# Patient Record
Sex: Female | Born: 1950
Health system: Southern US, Community
[De-identification: ages and names within clinical notes are randomized; demographics above are authoritative.]

## PROBLEM LIST (undated history)

## (undated) DIAGNOSIS — E785 Hyperlipidemia, unspecified: Secondary | ICD-10-CM

## (undated) DIAGNOSIS — H3530 Unspecified macular degeneration: Secondary | ICD-10-CM

## (undated) DIAGNOSIS — F329 Major depressive disorder, single episode, unspecified: Secondary | ICD-10-CM

## (undated) DIAGNOSIS — F32A Depression, unspecified: Secondary | ICD-10-CM

## (undated) HISTORY — DX: Unspecified macular degeneration: H35.30

## (undated) HISTORY — DX: Major depressive disorder, single episode, unspecified: F32.9

## (undated) HISTORY — DX: Hyperlipidemia, unspecified: E78.5

## (undated) HISTORY — PX: COLONOSCOPY: SHX174

## (undated) HISTORY — DX: Depression, unspecified: F32.A

## (undated) HISTORY — PX: BELPHAROPTOSIS REPAIR: SHX369

## (undated) HISTORY — PX: KNEE ARTHROSCOPY: SUR90

---

## 1999-01-27 DIAGNOSIS — F32A Depression, unspecified: Secondary | ICD-10-CM | POA: Insufficient documentation

## 2000-03-30 ENCOUNTER — Encounter: Payer: Self-pay | Admitting: Obstetrics and Gynecology

## 2000-03-30 ENCOUNTER — Encounter: Admission: RE | Admit: 2000-03-30 | Discharge: 2000-03-30 | Payer: Self-pay | Admitting: Obstetrics and Gynecology

## 2001-04-01 ENCOUNTER — Encounter: Payer: Self-pay | Admitting: Obstetrics and Gynecology

## 2001-04-01 ENCOUNTER — Encounter: Admission: RE | Admit: 2001-04-01 | Discharge: 2001-04-01 | Payer: Self-pay | Admitting: Obstetrics and Gynecology

## 2002-04-12 ENCOUNTER — Encounter: Admission: RE | Admit: 2002-04-12 | Discharge: 2002-04-12 | Payer: Self-pay | Admitting: Internal Medicine

## 2003-03-07 ENCOUNTER — Encounter: Admission: RE | Admit: 2003-03-07 | Discharge: 2003-03-07 | Payer: Self-pay | Admitting: Obstetrics and Gynecology

## 2003-03-29 ENCOUNTER — Encounter: Admission: RE | Admit: 2003-03-29 | Discharge: 2003-03-29 | Payer: Self-pay | Admitting: Internal Medicine

## 2003-05-27 HISTORY — PX: LIPOSUCTION: SHX10

## 2004-03-14 DIAGNOSIS — D229 Melanocytic nevi, unspecified: Secondary | ICD-10-CM

## 2004-03-14 HISTORY — DX: Melanocytic nevi, unspecified: D22.9

## 2004-04-11 ENCOUNTER — Encounter: Admission: RE | Admit: 2004-04-11 | Discharge: 2004-04-11 | Payer: Self-pay | Admitting: Obstetrics and Gynecology

## 2004-07-09 ENCOUNTER — Encounter: Admission: RE | Admit: 2004-07-09 | Discharge: 2004-07-09 | Payer: Self-pay | Admitting: Obstetrics and Gynecology

## 2005-04-16 ENCOUNTER — Encounter: Admission: RE | Admit: 2005-04-16 | Discharge: 2005-04-16 | Payer: Self-pay | Admitting: Obstetrics and Gynecology

## 2005-05-27 ENCOUNTER — Ambulatory Visit: Payer: Self-pay | Admitting: Internal Medicine

## 2005-07-28 ENCOUNTER — Ambulatory Visit: Payer: Self-pay | Admitting: Internal Medicine

## 2005-08-04 ENCOUNTER — Ambulatory Visit: Payer: Self-pay | Admitting: Internal Medicine

## 2006-04-21 ENCOUNTER — Encounter: Admission: RE | Admit: 2006-04-21 | Discharge: 2006-04-21 | Payer: Self-pay | Admitting: Obstetrics and Gynecology

## 2007-04-22 ENCOUNTER — Ambulatory Visit (HOSPITAL_COMMUNITY): Admission: RE | Admit: 2007-04-22 | Discharge: 2007-04-22 | Payer: Self-pay | Admitting: Obstetrics and Gynecology

## 2007-05-13 ENCOUNTER — Other Ambulatory Visit: Admission: RE | Admit: 2007-05-13 | Discharge: 2007-05-13 | Payer: Self-pay | Admitting: Obstetrics and Gynecology

## 2007-06-03 ENCOUNTER — Ambulatory Visit: Payer: Self-pay | Admitting: Internal Medicine

## 2007-06-08 ENCOUNTER — Telehealth: Payer: Self-pay | Admitting: Internal Medicine

## 2007-06-10 ENCOUNTER — Ambulatory Visit: Payer: Self-pay | Admitting: Internal Medicine

## 2007-06-10 ENCOUNTER — Encounter: Payer: Self-pay | Admitting: Internal Medicine

## 2007-06-14 ENCOUNTER — Encounter: Payer: Self-pay | Admitting: Internal Medicine

## 2007-09-09 ENCOUNTER — Telehealth: Payer: Self-pay | Admitting: Internal Medicine

## 2008-01-05 ENCOUNTER — Telehealth: Payer: Self-pay | Admitting: Internal Medicine

## 2008-03-23 ENCOUNTER — Ambulatory Visit: Payer: Self-pay | Admitting: Internal Medicine

## 2008-03-26 ENCOUNTER — Encounter: Admission: RE | Admit: 2008-03-26 | Discharge: 2008-03-26 | Payer: Self-pay | Admitting: Internal Medicine

## 2008-04-24 ENCOUNTER — Ambulatory Visit (HOSPITAL_COMMUNITY): Admission: RE | Admit: 2008-04-24 | Discharge: 2008-04-24 | Payer: Self-pay | Admitting: Internal Medicine

## 2008-05-08 ENCOUNTER — Ambulatory Visit: Payer: Self-pay | Admitting: Internal Medicine

## 2008-05-08 LAB — CONVERTED CEMR LAB
Basophils Absolute: 0 10*3/uL (ref 0.0–0.1)
Basophils Relative: 0.1 % (ref 0.0–3.0)
Bilirubin Urine: NEGATIVE
Blood in Urine, dipstick: NEGATIVE
CO2: 33 meq/L — ABNORMAL HIGH (ref 19–32)
Chloride: 108 meq/L (ref 96–112)
Creatinine, Ser: 0.8 mg/dL (ref 0.4–1.2)
Eosinophils Absolute: 0.2 10*3/uL (ref 0.0–0.7)
GFR calc non Af Amer: 78.79 mL/min (ref >60)
Glucose, Bld: 100 mg/dL — ABNORMAL HIGH (ref 70–99)
Glucose, Urine, Semiquant: NEGATIVE
HCT: 39.8 % (ref 36.0–46.0)
Hemoglobin: 13.5 g/dL (ref 12.0–15.0)
Lymphs Abs: 2.6 10*3/uL (ref 0.7–4.0)
MCHC: 33.8 g/dL (ref 30.0–36.0)
Neutro Abs: 3.5 10*3/uL (ref 1.4–7.7)
Nitrite: NEGATIVE
Platelets: 187 10*3/uL (ref 150.0–400.0)
RBC: 4.41 M/uL (ref 3.87–5.11)
Sodium: 147 meq/L — ABNORMAL HIGH (ref 135–145)
Specific Gravity, Urine: 1.01
TSH: 1.78 microintl units/mL (ref 0.35–5.50)
Total Bilirubin: 0.9 mg/dL (ref 0.3–1.2)
Total CHOL/HDL Ratio: 2
Triglycerides: 75 mg/dL (ref 0.0–149.0)
VLDL: 15 mg/dL (ref 0.0–40.0)
pH: 7

## 2008-05-15 ENCOUNTER — Ambulatory Visit: Payer: Self-pay | Admitting: Internal Medicine

## 2008-06-06 ENCOUNTER — Other Ambulatory Visit: Admission: RE | Admit: 2008-06-06 | Discharge: 2008-06-06 | Payer: Self-pay | Admitting: Obstetrics and Gynecology

## 2009-04-24 ENCOUNTER — Ambulatory Visit: Payer: Self-pay | Admitting: Family Medicine

## 2009-04-25 ENCOUNTER — Ambulatory Visit (HOSPITAL_COMMUNITY): Admission: RE | Admit: 2009-04-25 | Discharge: 2009-04-25 | Payer: Self-pay | Admitting: Obstetrics and Gynecology

## 2010-02-15 ENCOUNTER — Encounter: Payer: Self-pay | Admitting: Obstetrics and Gynecology

## 2010-02-25 NOTE — Assessment & Plan Note (Signed)
Summary: neck pain with arm numbness/dm   Vital Signs:  Patient profile:   60 year old female Temp:     98.7 degrees F oral BP sitting:   150 / 80  (left arm) Cuff size:   regular  Vitals Entered By: Sid Falcon LPN (April 24, 2009 4:11 PM) CC: neck pain, arm numbness   History of Present Illness: Patient seen as an acute visit with mostly right trapezius pain for the past few days. Does have some sensation of discomfort radiating occasionally toward the shoulder. No arm pain. Denies any significant numbness or weakness upper extremity. Pain is mild in intensity. Aspirin helps. She thinks this may be related to stress. Frequently has poor sleep issues. Several adjustment problems since her husband died about 4 years ago.  Allergies: 1)  ! Tylenol  Review of Systems  The patient denies chest pain, dyspnea on exertion, prolonged cough, headaches, and muscle weakness.    Physical Exam  General:  Well-developed,well-nourished,in no acute distress; alert,appropriate and cooperative throughout examination Head:  Normocephalic and atraumatic without obvious abnormalities. No apparent alopecia or balding. Neck:  No deformities, masses, or tenderness noted. Lungs:  Normal respiratory effort, chest expands symmetrically. Lungs are clear to auscultation, no crackles or wheezes. Heart:  Normal rate and regular rhythm. S1 and S2 normal without gallop, murmur, click, rub or other extra sounds. Msk:  tender right trapezius muscles Extremities:  upper extremities reveal no evidence for any atrophy. Good distal pulses. Full range of motion right shoulder Neurologic:  alert & oriented X3, cranial nerves II-XII intact, strength normal in all extremities, sensation intact to light touch, and DTRs symmetrical and normal.     Impression & Recommendations:  Problem # 1:  BACK PAIN, UPPER (ICD-724.5) suspect muscular. Trial of low dose muscle relaxer and also advised moist heat, massage, and regular  exercise Her updated medication list for this problem includes:    Cyclobenzaprine Hcl 5 Mg Tabs (Cyclobenzaprine hcl) ..... One by mouth at bedtime as needed pain and muscle spasm  Complete Medication List: 1)  Multivitamins Tabs (Multiple vitamin) .... Once daily 2)  Citracal/vitamin D 250-200 Mg-unit Tabs (Calcium citrate-vitamin d) .... 4 once daily 3)  Evening Primrose Oil 500 Mg Caps (Evening primrose oil) .... Once daily 4)  Vitamin B-12 1000 Mcg Tabs (Cyanocobalamin) .... Once daily 5)  Vitamin C Cr 500 Mg Cr-caps (Ascorbic acid) .... Once daily 6)  Fish Oil Oil (Fish oil) .... Omega 3  840mg  omega 6 420mg  omega 9  420mg  once daily 7)  Alprazolam 0.5 Mg Tabs (Alprazolam) .... Take one by mouth every other day as needed 8)  Cyclobenzaprine Hcl 5 Mg Tabs (Cyclobenzaprine hcl) .... One by mouth at bedtime as needed pain and muscle spasm  Patient Instructions: 1)  Follow up with your primary provider if you note any weakness, numbness of upper extremity, or any progressive pain. 2)  Use moist heat and consider muscle massage. 3)  Continue aspirin as needed. Prescriptions: CYCLOBENZAPRINE HCL 5 MG TABS (CYCLOBENZAPRINE HCL) one by mouth at bedtime as needed pain and muscle spasm  #30 x 0   Entered and Authorized by:   Evelena Peat MD   Signed by:   Evelena Peat MD on 04/24/2009   Method used:   Electronically to        [facility] # [phone]* (retail)       [address]       [place], [place]  [address]  Ph: [phone]       Fax: [phone]   RxID:   [id number]

## 2010-05-09 ENCOUNTER — Telehealth: Payer: Self-pay | Admitting: *Deleted

## 2010-05-09 MED ORDER — DOXYCYCLINE HYCLATE 100 MG PO TABS: 100.0000 mg | ORAL_TABLET | Freq: Two times a day (BID) | ORAL | Status: DC

## 2010-05-09 NOTE — Telephone Encounter (Signed)
Pt wants an antibiotic for her allergies that have turned into a sinus infection with green/yellow mucus. [facility].

## 2010-05-09 NOTE — Telephone Encounter (Signed)
Doxycycline 100 mg po bid for 7 days IF symptoms have been ongoing for >2weeks Otherwise OV with available physicians

## 2010-05-14 ENCOUNTER — Other Ambulatory Visit: Payer: Self-pay | Admitting: Obstetrics and Gynecology

## 2010-05-14 DIAGNOSIS — Z1231 Encounter for screening mammogram for malignant neoplasm of breast: Secondary | ICD-10-CM

## 2010-05-20 ENCOUNTER — Ambulatory Visit (HOSPITAL_COMMUNITY): Payer: Self-pay

## 2010-05-29 ENCOUNTER — Ambulatory Visit (HOSPITAL_COMMUNITY)
Admission: RE | Admit: 2010-05-29 | Discharge: 2010-05-29 | Disposition: A | Discharge status: Home / Self Care | Payer: Managed Care, Other (non HMO) | Source: Ambulatory Visit | Attending: Obstetrics and Gynecology | Admitting: Obstetrics and Gynecology

## 2010-05-29 DIAGNOSIS — Z1231 Encounter for screening mammogram for malignant neoplasm of breast: Secondary | ICD-10-CM | POA: Insufficient documentation

## 2011-06-08 ENCOUNTER — Other Ambulatory Visit: Payer: Self-pay | Admitting: Internal Medicine

## 2011-06-08 DIAGNOSIS — Z1231 Encounter for screening mammogram for malignant neoplasm of breast: Secondary | ICD-10-CM

## 2011-06-11 ENCOUNTER — Telehealth: Payer: Self-pay | Admitting: *Deleted

## 2011-06-11 NOTE — Telephone Encounter (Signed)
Alprazolam 0.25 mg po bid prn anxiety, for 5 days. #10/0 refills

## 2011-06-11 NOTE — Telephone Encounter (Signed)
Pt needs nerve meds for mother's death, please.

## 2011-06-11 NOTE — Telephone Encounter (Signed)
Rx called to pharmacy voicemail, pt notified. Pt states her dad is also in ICU in [facility] and she may have to fly out at any moment re: his health. Pt is concerned that 5 days is not going to be enough of the medication.  Please advise.

## 2011-06-11 NOTE — Telephone Encounter (Signed)
Left message to call back .  Please call back.

## 2011-06-11 NOTE — Telephone Encounter (Signed)
Pt left message, and we called back but we had to leave a message  To call back.

## 2011-06-18 ENCOUNTER — Telehealth: Payer: Self-pay

## 2011-06-18 MED ORDER — ALPRAZOLAM 0.5 MG PO TABS: 0.5000 mg | ORAL_TABLET | Freq: Three times a day (TID) | ORAL | Status: AC | PRN

## 2011-06-18 NOTE — Telephone Encounter (Signed)
Dr Cato Mulligan is not in the office today.  Per Dr Caryl Never ok to call in xanax 0.5 mg 1 po every 8 hours PRN #15 no refills.  Dr Caryl Never also wanted pt to know that she needed to grieve over the loss of her parents and try not use as much as possible.  Pt aware, rx called in to [facility].

## 2011-06-18 NOTE — Telephone Encounter (Signed)
Pt called and states that she called earlier this week asking for some alprazolam for her nerves due to losing her mother.  Pt states she received a call last night that her father passed away as well and pt has to leave town and plan a double funeral.  Pt states 10 pills of alprazolam is not enough.  Pt would like to know if she can have another rx sent to pharmacy to help her get through this.  Pt is aware Dr. Cato Mulligan is out of the office and ask if message can be sent to another physician.  Pls advise.

## 2011-06-26 ENCOUNTER — Other Ambulatory Visit: Payer: Managed Care, Other (non HMO)

## 2011-07-01 ENCOUNTER — Ambulatory Visit (HOSPITAL_COMMUNITY): Payer: Managed Care, Other (non HMO)

## 2011-07-03 ENCOUNTER — Encounter: Payer: Managed Care, Other (non HMO) | Admitting: Internal Medicine

## 2011-07-06 ENCOUNTER — Other Ambulatory Visit: Payer: Self-pay | Admitting: Internal Medicine

## 2011-07-06 DIAGNOSIS — Z1231 Encounter for screening mammogram for malignant neoplasm of breast: Secondary | ICD-10-CM

## 2011-07-27 ENCOUNTER — Ambulatory Visit (HOSPITAL_COMMUNITY)
Admission: RE | Admit: 2011-07-27 | Discharge: 2011-07-27 | Disposition: A | Discharge status: Home / Self Care | Payer: Managed Care, Other (non HMO) | Source: Ambulatory Visit | Attending: Internal Medicine | Admitting: Internal Medicine

## 2011-07-27 DIAGNOSIS — Z1231 Encounter for screening mammogram for malignant neoplasm of breast: Secondary | ICD-10-CM | POA: Insufficient documentation

## 2011-09-24 ENCOUNTER — Other Ambulatory Visit: Payer: Self-pay | Admitting: *Deleted

## 2011-09-24 ENCOUNTER — Other Ambulatory Visit (INDEPENDENT_AMBULATORY_CARE_PROVIDER_SITE_OTHER): Payer: Managed Care, Other (non HMO)

## 2011-09-24 DIAGNOSIS — Z0000 Encounter for general adult medical examination without abnormal findings: Secondary | ICD-10-CM

## 2011-09-24 LAB — BASIC METABOLIC PANEL
BUN: 16 mg/dL (ref 6–23)
CO2: 30 mEq/L (ref 19–32)
Calcium: 9.7 mg/dL (ref 8.4–10.5)
Chloride: 103 mEq/L (ref 96–112)
Creatinine, Ser: 0.9 mg/dL (ref 0.4–1.2)
GFR: 67.11 mL/min (ref >60.00)
Glucose, Bld: 84 mg/dL (ref 70–99)
Potassium: 6.2 mEq/L (ref 3.5–5.1)
Sodium: 141 mEq/L (ref 135–145)

## 2011-09-24 LAB — POCT URINALYSIS DIPSTICK
Blood, UA: NEGATIVE
Glucose, UA: NEGATIVE
Ketones, UA: NEGATIVE
Leukocytes, UA: NEGATIVE
Spec Grav, UA: 1.015
Urobilinogen, UA: 0.2

## 2011-09-24 LAB — LIPID PANEL
HDL: 88.3 mg/dL (ref >39.00)
Total CHOL/HDL Ratio: 2
Triglycerides: 79 mg/dL (ref 0.0–149.0)

## 2011-09-24 LAB — CBC WITH DIFFERENTIAL/PLATELET
Basophils Absolute: 0 10*3/uL (ref 0.0–0.1)
Eosinophils Relative: 1.4 % (ref 0.0–5.0)
Hemoglobin: 14.2 g/dL (ref 12.0–15.0)
Lymphs Abs: 2.9 10*3/uL (ref 0.7–4.0)
Monocytes Absolute: 0.6 10*3/uL (ref 0.1–1.0)
RBC: 4.87 Mil/uL (ref 3.87–5.11)
WBC: 6.7 10*3/uL (ref 4.5–10.5)

## 2011-09-24 LAB — HEPATIC FUNCTION PANEL
Alkaline Phosphatase: 76 U/L (ref 39–117)
Total Bilirubin: 0.7 mg/dL (ref 0.3–1.2)

## 2011-09-29 ENCOUNTER — Other Ambulatory Visit (INDEPENDENT_AMBULATORY_CARE_PROVIDER_SITE_OTHER): Payer: Managed Care, Other (non HMO)

## 2011-09-29 DIAGNOSIS — E875 Hyperkalemia: Secondary | ICD-10-CM

## 2011-09-29 LAB — BASIC METABOLIC PANEL
Creatinine, Ser: 0.7 mg/dL (ref 0.4–1.2)
GFR: 87.93 mL/min (ref >60.00)
Glucose, Bld: 87 mg/dL (ref 70–99)
Sodium: 134 mEq/L — ABNORMAL LOW (ref 135–145)

## 2011-09-30 NOTE — Progress Notes (Signed)
Quick Note:  Called pt and pt is aware. ______

## 2011-10-14 ENCOUNTER — Other Ambulatory Visit: Payer: Managed Care, Other (non HMO)

## 2011-10-21 ENCOUNTER — Encounter: Payer: Self-pay | Admitting: Internal Medicine

## 2011-10-21 ENCOUNTER — Ambulatory Visit (INDEPENDENT_AMBULATORY_CARE_PROVIDER_SITE_OTHER): Payer: Managed Care, Other (non HMO) | Admitting: Internal Medicine

## 2011-10-21 VITALS — BP 130/82 | HR 76 | Temp 98.8°F | Ht 59.5 in | Wt 121.0 lb

## 2011-10-21 DIAGNOSIS — F3289 Other specified depressive episodes: Secondary | ICD-10-CM

## 2011-10-21 DIAGNOSIS — F32A Depression, unspecified: Secondary | ICD-10-CM

## 2011-10-21 DIAGNOSIS — F329 Major depressive disorder, single episode, unspecified: Secondary | ICD-10-CM

## 2011-10-21 DIAGNOSIS — Z0000 Encounter for general adult medical examination without abnormal findings: Secondary | ICD-10-CM

## 2011-10-21 MED ORDER — BUPROPION HCL ER (XL) 150 MG PO TB24: 150.0000 mg | ORAL_TABLET | Freq: Every day | ORAL | Status: DC

## 2011-10-21 NOTE — Progress Notes (Signed)
  Subjective:    Patient ID: Denise Bradley, female    DOB: 1950-02-02, 61 y.o.   MRN: [id number]  HPI cpx  As soon as I walked in the room the patient started crying. She admitted that she has not felt well for years. She feels chronically depressed. She has occasionally seen a psychologist. History reviewed. No pertinent past medical history.  History   Social History  . Marital Status: Widowed    Spouse Name: N/A    Number of Children: N/A  . Years of Education: N/A   Occupational History  . Not on file.   Social History Main Topics  . Smoking status: Never Smoker   . Smokeless tobacco: Not on file  . Alcohol Use: No  . Drug Use: No  . Sexually Active: Not on file   Other Topics Concern  . Not on file   Social History Narrative  . No narrative on file    Past Surgical History  Procedure Date  . Knee arthroscopy   . Liposuction 5/05    Family History  Problem Relation Age of Onset  . Diabetes Father   . Hypertension Father   . Heart disease Father     CABG  . Cancer Mother 44    cancer unknown primary  . COPD Mother     Allergies  Allergen Reactions  . Acetaminophen     REACTION: swells throat shut/can't breathe    Current Outpatient Prescriptions on File Prior to Visit  Medication Sig Dispense Refill  . buPROPion (WELLBUTRIN XL) 150 MG 24 hr tablet Take 1 tablet (150 mg total) by mouth daily.  30 tablet  3     patient denies chest pain, shortness of breath, orthopnea. Denies lower extremity edema, abdominal pain, change in appetite, change in bowel movements. Patient denies rashes, musculoskeletal complaints. No other specific complaints in a complete review of systems.   BP 130/82  Pulse 76  Temp 98.8 F (37.1 C) (Oral)  Ht 4' 11.5" (1.511 m)  Wt 121 lb (54.885 kg)  BMI 24.03 kg/m2    Review of Systems     Objective:   Physical Exam  Crying, Well-developed well-nourished female in no acute distress. HEENT exam atraumatic,  normocephalic, extraocular muscles are intact. Neck is supple. No jugular venous distention no thyromegaly. Chest clear to auscultation without increased work of breathing. Cardiac exam S1 and S2 are regular. Abdominal exam active bowel sounds, soft, nontender. Extremities no edema. Neurologic exam she is alert without any motor sensory deficits. Gait is normal. External genitalia appear normal. Cervix appears normal. Hasn't obtained.       Assessment & Plan:  Well visit,  Health maint UTD

## 2011-10-23 NOTE — Assessment & Plan Note (Signed)
Long-term history of depression discussed September of 2013. Started on low-dose Wellbutrin. Referred to psychiatry. She is not suicidal or homicidal at this time.

## 2011-11-05 ENCOUNTER — Ambulatory Visit: Payer: Managed Care, Other (non HMO) | Admitting: Psychology

## 2011-11-19 ENCOUNTER — Ambulatory Visit (INDEPENDENT_AMBULATORY_CARE_PROVIDER_SITE_OTHER): Payer: PRIVATE HEALTH INSURANCE | Admitting: Psychology

## 2011-11-19 DIAGNOSIS — F331 Major depressive disorder, recurrent, moderate: Secondary | ICD-10-CM

## 2011-11-20 ENCOUNTER — Telehealth: Payer: Self-pay | Admitting: Family Medicine

## 2011-11-20 NOTE — Telephone Encounter (Signed)
Pt resched her PAP, but is wondering at her age, is it even necessary? She was thinking that she needed it only every 41yrs. She def wants to come if he feels she needs it, but if she can wait til next year, she'll do that. Please advise and call pt back on cell. Thanks!

## 2011-11-22 NOTE — Telephone Encounter (Signed)
Ok to wait

## 2011-11-23 NOTE — Telephone Encounter (Signed)
appt cancelled, pt aware

## 2011-11-24 ENCOUNTER — Ambulatory Visit (INDEPENDENT_AMBULATORY_CARE_PROVIDER_SITE_OTHER): Payer: PRIVATE HEALTH INSURANCE | Admitting: Psychology

## 2011-11-24 DIAGNOSIS — F331 Major depressive disorder, recurrent, moderate: Secondary | ICD-10-CM

## 2011-11-25 ENCOUNTER — Ambulatory Visit: Payer: Managed Care, Other (non HMO) | Admitting: Internal Medicine

## 2011-11-26 ENCOUNTER — Ambulatory Visit: Payer: Managed Care, Other (non HMO) | Admitting: Internal Medicine

## 2011-11-30 ENCOUNTER — Ambulatory Visit: Payer: Managed Care, Other (non HMO) | Admitting: Psychology

## 2011-12-08 ENCOUNTER — Ambulatory Visit: Payer: Self-pay | Admitting: Psychology

## 2012-01-15 HISTORY — PX: FOOT SURGERY: SHX648

## 2012-03-08 ENCOUNTER — Other Ambulatory Visit: Payer: Self-pay | Admitting: *Deleted

## 2012-03-08 MED ORDER — BUPROPION HCL ER (XL) 150 MG PO TB24: 150.0000 mg | ORAL_TABLET | Freq: Every day | ORAL | Status: DC

## 2012-04-19 ENCOUNTER — Telehealth: Payer: Self-pay | Admitting: Internal Medicine

## 2012-04-19 NOTE — Telephone Encounter (Signed)
Pt is requesting bupropion 150 mg [facility] Pt is requesting a specify manufacture for this medication PAR/ANCHEN pt like small pills.

## 2012-04-20 NOTE — Telephone Encounter (Signed)
rx sent in electronically 

## 2012-04-28 MED ORDER — BUPROPION HCL ER (XL) 150 MG PO TB24: 150.0000 mg | ORAL_TABLET | Freq: Every day | ORAL | Status: DC

## 2012-04-28 NOTE — Telephone Encounter (Signed)
Added mfg to note and resent rx

## 2012-04-28 NOTE — Addendum Note (Signed)
Addended by: Alfred Levins D on: 04/28/2012 12:09 PM   Modules accepted: Orders

## 2012-07-11 ENCOUNTER — Encounter: Payer: Self-pay | Admitting: Internal Medicine

## 2012-07-26 HISTORY — PX: FOOT SURGERY: SHX648

## 2012-09-12 ENCOUNTER — Other Ambulatory Visit: Payer: Self-pay | Admitting: Internal Medicine

## 2012-09-12 DIAGNOSIS — Z1231 Encounter for screening mammogram for malignant neoplasm of breast: Secondary | ICD-10-CM

## 2012-09-13 ENCOUNTER — Ambulatory Visit (HOSPITAL_COMMUNITY)
Admission: RE | Admit: 2012-09-13 | Discharge: 2012-09-13 | Disposition: A | Discharge status: Home / Self Care | Payer: Managed Care, Other (non HMO) | Source: Ambulatory Visit | Attending: Internal Medicine | Admitting: Internal Medicine

## 2012-09-13 ENCOUNTER — Other Ambulatory Visit: Payer: Self-pay | Admitting: Internal Medicine

## 2012-09-13 DIAGNOSIS — Z1231 Encounter for screening mammogram for malignant neoplasm of breast: Secondary | ICD-10-CM

## 2012-11-03 ENCOUNTER — Ambulatory Visit (INDEPENDENT_AMBULATORY_CARE_PROVIDER_SITE_OTHER): Payer: Managed Care, Other (non HMO) | Admitting: Podiatry

## 2012-11-03 ENCOUNTER — Encounter: Payer: Self-pay | Admitting: Podiatry

## 2012-11-03 ENCOUNTER — Ambulatory Visit (INDEPENDENT_AMBULATORY_CARE_PROVIDER_SITE_OTHER): Payer: Managed Care, Other (non HMO)

## 2012-11-03 VITALS — BP 145/80 | HR 70 | Resp 12 | Ht 65.0 in | Wt 119.0 lb

## 2012-11-03 DIAGNOSIS — Z9889 Other specified postprocedural states: Secondary | ICD-10-CM

## 2012-11-03 NOTE — Progress Notes (Signed)
Subjective:     Patient ID: Denise Bradley, female   DOB: 01/14/1951, 62 y.o.   MRN: [id number]  Foot Pain   patient presents with find stating that her right foot underneath the first metatarsal is still hurting. She states that the medication seem to help her but she is still noticing like she is walking on a rock. the the outside of her foot has healed well and the callus formation is improved she states   Review of Systems  All other systems reviewed and are negative.       Objective:   Physical Exam  Nursing note and vitals reviewed. Cardiovascular: Intact distal pulses.   Musculoskeletal: Normal range of motion.  Neurological: She is alert.  Skin: Skin is warm.   on the plantar aspect of the right first metatarsal there is pinpoint tenderness located around the fibular sesamoid. The swelling that she was experiencing in the entire metatarsal plantar surface has resolved leaving her with this one area of discrete tenderness.     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    Plan:     H&P performed. X-rays With marker are reviewed. I reviewed with her and her friend the appearance fibular sesamoiditis. We're going to modify her orthotics to try to reduce the pressure against this area. I did discuss the difficulty of this case consideration for progression if improvement is not forthcoming with the orthotic. I would recommend fibular sesamoidectomy along with removal of the 2 screws that were holding the metatarsocuneiform joint. I explained the possibility for stability issues associated with fibular sesamoidal removal but I do feel if pain were to be persistent this would give her the best chance of resolution of symptoms. We may consider an MRI if symptoms persist. We will see her back when orthotics returned.

## 2012-11-10 ENCOUNTER — Telehealth: Payer: Self-pay | Admitting: Internal Medicine

## 2012-11-10 NOTE — Telephone Encounter (Signed)
Pt request to increase buPROPion (WELLBUTRIN XL) 150 MG 24 hr tablet to 300mg . Pt states this seems to be working, but needs a little more. Pt last seen 10/13 Pt states the pharm has been out of this drug for a month, and she has not taken this drug in a month. Can you send 30 day to  [facility] [address]  and then a [facility]

## 2012-11-11 MED ORDER — BUPROPION HCL ER (XL) 300 MG PO TB24: 300.0000 mg | ORAL_TABLET | Freq: Every day | ORAL | Status: DC

## 2012-11-11 NOTE — Telephone Encounter (Signed)
30 days with 1 refill called in to [facility].  Can you call pt and schedule a f/u with Padonda?  Thanks

## 2012-11-11 NOTE — Telephone Encounter (Signed)
Ok to refill Have her f/u with padonda within the next 60 days

## 2012-11-14 ENCOUNTER — Other Ambulatory Visit: Payer: Self-pay | Admitting: *Deleted

## 2012-11-14 MED ORDER — BUPROPION HCL ER (XL) 300 MG PO TB24: 300.0000 mg | ORAL_TABLET | Freq: Every day | ORAL | Status: DC

## 2012-11-14 NOTE — Telephone Encounter (Signed)
[facility]

## 2012-11-24 ENCOUNTER — Encounter: Payer: Self-pay | Admitting: Podiatry

## 2012-11-24 ENCOUNTER — Ambulatory Visit (INDEPENDENT_AMBULATORY_CARE_PROVIDER_SITE_OTHER): Payer: Managed Care, Other (non HMO) | Admitting: Podiatry

## 2012-11-24 VITALS — BP 130/78 | HR 78 | Resp 16

## 2012-11-24 DIAGNOSIS — M948X9 Other specified disorders of cartilage, unspecified sites: Secondary | ICD-10-CM

## 2012-11-24 NOTE — Patient Instructions (Signed)
Build up walking in the next 2 weeks and then return to work

## 2012-11-27 NOTE — Progress Notes (Signed)
Subjective:     Patient ID: Denise Bradley, female   DOB: July 05, 1950, 62 y.o.   MRN: [id number]  Foot Pain   patient states my foot is feeling some better but I still can feel that small rock in the bottom of my foot. States the orthotics are helping but when she does not wear them she is still having some discomfort   Review of Systems  All other systems reviewed and are negative.       Objective:   Physical Exam  Nursing note and vitals reviewed. Constitutional: She is oriented to person, place, and time.  Cardiovascular: Intact distal pulses.   Musculoskeletal: Normal range of motion.  Neurological: She is oriented to person, place, and time.  Skin: Skin is warm.   patient is found to have reduced edema in the sub-first metatarsal area with excellent healing of the incision sites on the medial and lateral foot upon palpation I did note that there is still some prominence around the fibular sesamoid bone but it is only moderately tender when pressed     Assessment:     Patient is showing hopefully improvement and I am reasonably optimistic we will not have to remove the fibular sesamoid at this point    Plan:     Educated her on where she is and advised on continued shoe gear usage with orthotics and to continue range of motion exercises of the first metatarsal phalangeal joint. We are going to try to have her return to work in 2 weeks and see how she holds up and then make an informed decision to her problem

## 2012-12-06 DIAGNOSIS — Z9889 Other specified postprocedural states: Secondary | ICD-10-CM | POA: Insufficient documentation

## 2012-12-26 ENCOUNTER — Encounter: Payer: Self-pay | Admitting: Family

## 2012-12-26 ENCOUNTER — Ambulatory Visit (INDEPENDENT_AMBULATORY_CARE_PROVIDER_SITE_OTHER): Payer: Managed Care, Other (non HMO) | Admitting: Family

## 2012-12-26 VITALS — BP 132/88 | HR 70 | Wt 121.0 lb

## 2012-12-26 DIAGNOSIS — F411 Generalized anxiety disorder: Secondary | ICD-10-CM

## 2012-12-26 DIAGNOSIS — F3289 Other specified depressive episodes: Secondary | ICD-10-CM

## 2012-12-26 DIAGNOSIS — F329 Major depressive disorder, single episode, unspecified: Secondary | ICD-10-CM

## 2012-12-26 DIAGNOSIS — F32A Depression, unspecified: Secondary | ICD-10-CM

## 2012-12-26 MED ORDER — BUPROPION HCL ER (XL) 300 MG PO TB24: 300.0000 mg | ORAL_TABLET | Freq: Every day | ORAL | Status: DC

## 2012-12-26 NOTE — Progress Notes (Signed)
   Subjective:    Patient ID: Denise Bradley, female    DOB: 10/18/1950, 62 y.o.   MRN: [id number]  HPI  62 year old white female, nonsmoker, patient of Dr. Cato Mulligan is in today for a recheck of depression. She's currently taking Wellbutrin 300 mg once daily. Occasionally feels like she needs something for acute anxiety and in the past been on Xanax. She is unsure of the dosage.  Review of Systems  Constitutional: Negative.   Respiratory: Negative.   Cardiovascular: Negative.   Endocrine: Negative.   Musculoskeletal: Negative.   Skin: Negative.   Neurological: Negative.   Hematological: Negative.   Psychiatric/Behavioral: Negative.    History reviewed. No pertinent past medical history.  History   Social History  . Marital Status: Widowed    Spouse Name: N/A    Number of Children: N/A  . Years of Education: N/A   Occupational History  . Not on file.   Social History Main Topics  . Smoking status: Never Smoker   . Smokeless tobacco: Not on file  . Alcohol Use: No  . Drug Use: No  . Sexual Activity: Not on file   Other Topics Concern  . Not on file   Social History Narrative  . No narrative on file    Past Surgical History  Procedure Laterality Date  . Knee arthroscopy    . Liposuction  5/05    Family History  Problem Relation Age of Onset  . Diabetes Father   . Hypertension Father   . Heart disease Father     CABG  . Cancer Mother 28    cancer unknown primary  . COPD Mother     Allergies  Allergen Reactions  . Acetaminophen     REACTION: swells throat shut/can't breathe    No current outpatient prescriptions on file prior to visit.   No current facility-administered medications on file prior to visit.    BP 132/88  Pulse 70  Wt 121 lb (54.885 kg)chart    Objective:   Physical Exam  Constitutional: She is oriented to person, place, and time. She appears well-developed and well-nourished.  Neck: Neck supple.  Cardiovascular: Normal  rate, regular rhythm and normal heart sounds.   Pulmonary/Chest: Effort normal and breath sounds normal.  Musculoskeletal: Normal range of motion.  Neurological: She is alert and oriented to person, place, and time.  Skin: Skin is warm and dry.  Psychiatric: She has a normal mood and affect.          Assessment & Plan:  Assessment: 1. Depression 2. Anxiety  Plan: Continue current medication. Encouraged healthy diet, exercise. Followup in 6 months and sooner as needed. She will call and let us know what benzo so she's been on in the past I will consider calling it into the pharmacy for acute use only.

## 2012-12-26 NOTE — Patient Instructions (Signed)

## 2012-12-28 ENCOUNTER — Encounter: Payer: Self-pay | Admitting: Internal Medicine

## 2012-12-28 ENCOUNTER — Other Ambulatory Visit: Payer: Self-pay | Admitting: Family

## 2012-12-28 MED ORDER — ALPRAZOLAM 0.5 MG PO TABS: 0.5000 mg | ORAL_TABLET | Freq: Every day | ORAL | Status: DC | PRN

## 2013-02-15 ENCOUNTER — Ambulatory Visit (INDEPENDENT_AMBULATORY_CARE_PROVIDER_SITE_OTHER): Payer: Managed Care, Other (non HMO)

## 2013-02-15 ENCOUNTER — Ambulatory Visit (INDEPENDENT_AMBULATORY_CARE_PROVIDER_SITE_OTHER): Payer: Managed Care, Other (non HMO) | Admitting: Podiatry

## 2013-02-15 ENCOUNTER — Encounter: Payer: Self-pay | Admitting: Podiatry

## 2013-02-15 VITALS — BP 152/85 | HR 59 | Resp 16

## 2013-02-15 DIAGNOSIS — M948X9 Other specified disorders of cartilage, unspecified sites: Secondary | ICD-10-CM

## 2013-02-15 DIAGNOSIS — M779 Enthesopathy, unspecified: Secondary | ICD-10-CM

## 2013-02-15 DIAGNOSIS — M216X9 Other acquired deformities of unspecified foot: Secondary | ICD-10-CM

## 2013-02-15 MED ORDER — TRIAMCINOLONE ACETONIDE 10 MG/ML IJ SUSP
10.0000 mg | Freq: Once | INTRAMUSCULAR | Status: AC
  Administered 2013-02-15: 10 mg

## 2013-02-15 NOTE — Progress Notes (Signed)
Subjective:     Patient ID: Denise Bradley, female   DOB: February 17, 1950, 63 y.o.   MRN: [id number]  HPI patient presents to the office stating that she is now working full time but does not feel like she walks normally even though she has no pain in shoes at work but does have mild discomfort when her shoes are off at home. States she still walks on a bone on the inside of her foot but does not have the pain she used to prior to be doing surgery   Review of Systems     Objective:   Physical Exam Neurovascular status intact with no apparent change in the first MPJ with good range of motion and some lateral meaning of the right hallux against the second toe with mild prominence around the fibular sesamoid right    Assessment:     Difficult condition that she had previous metatarsal cuneiform fusion causing lowering of the first metatarsal which has caused probable low-grade irritation. I'm hoping there is also some inflammation present    Plan:     Reviewed x-rays and today I did a careful injection to try to reduce inflammation. I do not recommend further surgery as I do not see a big benefit and I am concerned that it could make her situation worse and at this time she is able to walk normally and work without pain

## 2013-02-22 ENCOUNTER — Telehealth: Payer: Self-pay | Admitting: *Deleted

## 2013-02-22 ENCOUNTER — Encounter: Payer: Self-pay | Admitting: Internal Medicine

## 2013-02-22 NOTE — Telephone Encounter (Signed)
Pt states received an injection 02/15/2013 and would like to know what it was.  I informed pt, steroid Kenalog and numbing agent.  Pt states Dr Paulla Dolly stated it would last 6 months, and she would like to know when to expect, it would be the best it would be.  I told her it would be a gradual affect and to use ice 10 -15 minutes for 3 - 4 times a day to help with the swelling and inflammation.

## 2013-02-23 ENCOUNTER — Ambulatory Visit: Payer: Managed Care, Other (non HMO) | Admitting: Podiatry

## 2013-02-27 ENCOUNTER — Encounter: Payer: Self-pay | Admitting: Internal Medicine

## 2013-03-01 ENCOUNTER — Other Ambulatory Visit: Payer: Self-pay | Admitting: Physician Assistant

## 2013-04-12 ENCOUNTER — Ambulatory Visit (AMBULATORY_SURGERY_CENTER): Payer: Self-pay | Admitting: *Deleted

## 2013-04-12 VITALS — Ht 61.0 in | Wt 120.0 lb

## 2013-04-12 DIAGNOSIS — Z86010 Personal history of colonic polyps: Secondary | ICD-10-CM

## 2013-04-12 MED ORDER — NA SULFATE-K SULFATE-MG SULF 17.5-3.13-1.6 GM/177ML PO SOLN: ORAL | Status: DC

## 2013-04-12 NOTE — Progress Notes (Signed)
Patient denies any allergies to eggs or soy. Patient denies any problems with anesthesia. Patient states she has "a lot of drainage" and wants to be watched carefully during procedure.

## 2013-04-21 ENCOUNTER — Encounter: Payer: Self-pay | Admitting: Internal Medicine

## 2013-04-26 ENCOUNTER — Ambulatory Visit (AMBULATORY_SURGERY_CENTER): Payer: Medicare HMO | Admitting: Internal Medicine

## 2013-04-26 ENCOUNTER — Encounter: Payer: Self-pay | Admitting: Internal Medicine

## 2013-04-26 VITALS — BP 138/75 | HR 69 | Temp 98.7°F | Resp 16 | Ht 61.0 in | Wt 120.0 lb

## 2013-04-26 DIAGNOSIS — Z86010 Personal history of colonic polyps: Secondary | ICD-10-CM

## 2013-04-26 MED ORDER — SODIUM CHLORIDE 0.9 % IV SOLN: 500.0000 mL | INTRAVENOUS | Status: DC

## 2013-04-26 NOTE — Progress Notes (Signed)
Lidocaine-40mg IV prior to Propofol InductionPropofol given over incremental dosages 

## 2013-04-26 NOTE — Op Note (Signed)
[facility]  [address] [place], [address]   COLONOSCOPY PROCEDURE REPORT  PATIENT: Denise, Bradley  MR#: [id number] BIRTHDATE: 1950/10/12 , 63  yrs. old GENDER: Female ENDOSCOPIST: Gatha Mayer, MD, [facility] PROCEDURE DATE:  04/26/2013 PROCEDURE:   Colonoscopy, screening First Screening Colonoscopy - Avg.  risk and is 50 yrs.  old or older - No.  Prior Negative Screening - Now for repeat screening. N/A  History of Adenoma - Now for follow-up colonoscopy & has been > or = to 3 yrs.  Yes hx of adenoma.  Has been 3 or more years since last colonoscopy.  Polyps Removed Today? No.  Recommend repeat exam, <10 yrs? No. ASA CLASS:   Class I INDICATIONS:Patient's personal history of adenomatous colon polyps.  MEDICATIONS: propofol (Diprivan) 200mg  IV, MAC sedation, administered by CRNA, and These medications were titrated to patient response per physician's verbal order  DESCRIPTION OF PROCEDURE:   After the risks benefits and alternatives of the procedure were thoroughly explained, informed consent was obtained.  A digital rectal exam revealed no abnormalities of the rectum.   The LB [id number]  endoscope was introduced through the anus and advanced to the cecum, which was identified by both the appendix and ileocecal valve. No adverse events experienced.   The quality of the prep was excellent using Suprep  The instrument was then slowly withdrawn as the colon was fully examined.  COLON FINDINGS: There was mild scattered diverticulosis noted in the sigmoid colon.   The colon mucosa was otherwise normal.   A right colon retroflexion was performed.  Retroflexed views revealed no abnormalities. The time to cecum=3 minutes 08 seconds.  Withdrawal time=8 minutes 50 seconds.  The scope was withdrawn and the procedure completed. COMPLICATIONS: There were no complications.  ENDOSCOPIC IMPRESSION: 1.   There was mild diverticulosis noted in the sigmoid  colon 2.   The colon mucosa was otherwise normal w/ excellent prep - hx 5 mm adenoma in 2009  RECOMMENDATIONS: Repeat colonoscopy 10 years.   eSigned:  Gatha Mayer, MD, [facility] 04/26/2013 11:45 AM   cc: The Patient

## 2013-04-26 NOTE — Patient Instructions (Addendum)
No polyps today!  Next routine colonoscopy in 10 years - 2025  I appreciate the opportunity to care for you. Gatha Mayer, MD, FACG   YOU HAD AN ENDOSCOPIC PROCEDURE TODAY AT [facility]: Refer to the procedure report that was given to you for any specific questions about what was found during the examination.  If the procedure report does not answer your questions, please call your gastroenterologist to clarify.  If you requested that your care partner not be given the details of your procedure findings, then the procedure report has been included in a sealed envelope for you to review at your convenience later.  YOU SHOULD EXPECT: Some feelings of bloating in the abdomen. Passage of more gas than usual.  Walking can help get rid of the air that was put into your GI tract during the procedure and reduce the bloating. If you had a lower endoscopy (such as a colonoscopy or flexible sigmoidoscopy) you may notice spotting of blood in your stool or on the toilet paper. If you underwent a bowel prep for your procedure, then you may not have a normal bowel movement for a few days.  DIET: Your first meal following the procedure should be a light meal and then it is ok to progress to your normal diet.  A half-sandwich or bowl of soup is an example of a good first meal.  Heavy or fried foods are harder to digest and may make you feel nauseous or bloated.  Likewise meals heavy in dairy and vegetables can cause extra gas to form and this can also increase the bloating.  Drink plenty of fluids but you should avoid alcoholic beverages for 24 hours.  ACTIVITY: Your care partner should take you home directly after the procedure.  You should plan to take it easy, moving slowly for the rest of the day.  You can resume normal activity the day after the procedure however you should NOT DRIVE or use heavy machinery for 24 hours (because of the sedation medicines used during the test).    SYMPTOMS TO  REPORT IMMEDIATELY: A gastroenterologist can be reached at any hour.  During normal business hours, 8:30 AM to 5:00 PM Monday through Friday, call [phone].  After hours and on weekends, please call the GI answering service at [phone] who will take a message and have the physician on call contact you.   Following lower endoscopy (colonoscopy or flexible sigmoidoscopy):  Excessive amounts of blood in the stool  Significant tenderness or worsening of abdominal pains  Swelling of the abdomen that is new, acute  Fever of 100F or higher  Following upper endoscopy (EGD)  Vomiting of blood or coffee ground material  New chest pain or pain under the shoulder blades  Painful or persistently difficult swallowing  New shortness of breath  Fever of 100F or higher  Black, tarry-looking stools  FOLLOW UP: If any biopsies were taken you will be contacted by phone or by letter within the next 1-3 weeks.  Call your gastroenterologist if you have not heard about the biopsies in 3 weeks.  Our staff will call the home number listed on your records the next business day following your procedure to check on you and address any questions or concerns that you may have at that time regarding the information given to you following your procedure. This is a courtesy call and so if there is no answer at the home number and we have not heard  from you through the emergency physician on call, we will assume that you have returned to your regular daily activities without incident.  SIGNATURES/CONFIDENTIALITY: You and/or your care partner have signed paperwork which will be entered into your electronic medical record.  These signatures attest to the fact that that the information above on your After Visit Summary has been reviewed and is understood.  Full responsibility of the confidentiality of this discharge information lies with you and/or your care-partner.   Information on diverticulosis given to you  today

## 2013-04-27 ENCOUNTER — Telehealth: Payer: Self-pay

## 2013-04-27 NOTE — Telephone Encounter (Signed)
  Follow up Call-  Call back number 04/26/2013  Post procedure Call Back phone  # [phone]  Permission to leave phone message Yes     Patient questions:  Do you have a fever, pain , or abdominal swelling? no Pain Score  0 *  Have you tolerated food without any problems? yes  Have you been able to return to your normal activities? yes  Do you have any questions about your discharge instructions: Diet   no Medications  no Follow up visit  no  Do you have questions or concerns about your Care? no  Actions: * If pain score is 4 or above: No action needed, pain <4.  No problems per the pt. Maw

## 2013-06-16 ENCOUNTER — Other Ambulatory Visit: Payer: Self-pay | Admitting: Family

## 2013-08-17 ENCOUNTER — Other Ambulatory Visit: Payer: Self-pay | Admitting: Internal Medicine

## 2013-08-17 DIAGNOSIS — Z1231 Encounter for screening mammogram for malignant neoplasm of breast: Secondary | ICD-10-CM

## 2013-09-06 ENCOUNTER — Telehealth: Payer: Self-pay | Admitting: Internal Medicine

## 2013-09-06 ENCOUNTER — Ambulatory Visit (HOSPITAL_COMMUNITY)
Admission: RE | Admit: 2013-09-06 | Discharge: 2013-09-06 | Disposition: A | Discharge status: Home / Self Care | Payer: Medicare HMO | Source: Ambulatory Visit | Attending: Internal Medicine | Admitting: Internal Medicine

## 2013-09-06 ENCOUNTER — Other Ambulatory Visit: Payer: Self-pay | Admitting: Family

## 2013-09-06 DIAGNOSIS — Z1231 Encounter for screening mammogram for malignant neoplasm of breast: Secondary | ICD-10-CM | POA: Diagnosis present

## 2013-09-06 NOTE — Telephone Encounter (Signed)
Pt is wanting to know if she can get a 90 day supply for rx buPROPion (WELLBUTRIN XL) 300 MG 24 hr [facility], [facility], however it was only for 30 days.

## 2013-09-06 NOTE — Telephone Encounter (Signed)
Pt needs a follow up. No refills until follow up. Please call to schedule

## 2013-09-06 NOTE — Telephone Encounter (Signed)
appt scheduled for pt.  

## 2013-09-08 ENCOUNTER — Ambulatory Visit (INDEPENDENT_AMBULATORY_CARE_PROVIDER_SITE_OTHER): Payer: Medicare HMO | Admitting: Family

## 2013-09-08 ENCOUNTER — Encounter: Payer: Self-pay | Admitting: Family

## 2013-09-08 VITALS — BP 118/80 | HR 103 | Wt 119.0 lb

## 2013-09-08 DIAGNOSIS — G4700 Insomnia, unspecified: Secondary | ICD-10-CM

## 2013-09-08 DIAGNOSIS — F32A Depression, unspecified: Secondary | ICD-10-CM

## 2013-09-08 DIAGNOSIS — F329 Major depressive disorder, single episode, unspecified: Secondary | ICD-10-CM

## 2013-09-08 DIAGNOSIS — F3289 Other specified depressive episodes: Secondary | ICD-10-CM

## 2013-09-08 MED ORDER — ALPRAZOLAM 0.5 MG PO TABS: 0.5000 mg | ORAL_TABLET | Freq: Every day | ORAL | Status: DC | PRN

## 2013-09-08 MED ORDER — BUPROPION HCL ER (XL) 300 MG PO TB24: ORAL_TABLET | ORAL | Status: DC

## 2013-09-08 NOTE — Progress Notes (Signed)
Pre visit review using our clinic review tool, if applicable. No additional management support is needed unless otherwise documented below in the visit note. 

## 2013-09-08 NOTE — Progress Notes (Signed)
   Subjective:    Patient ID: Denise Bradley, female    DOB: 1950-08-11, 63 y.o.   MRN: [id number]  HPI 63 year old white female, nonsmoker with a history of depression is in today for recheck. Reports he is doing well on bupropion 300 mg once daily. Takes Xanax intermittently. Refills expired. Requesting a temporary supply. Denies any helplessness, hopelessness, loss of death or dying.    Review of Systems  Constitutional: Negative.   Respiratory: Negative.   Cardiovascular: Negative.   Gastrointestinal: Negative.   Endocrine: Negative.   Genitourinary: Negative.   Musculoskeletal: Negative.   Skin: Negative.   Allergic/Immunologic: Negative.   Neurological: Negative.   Hematological: Negative.   Psychiatric/Behavioral: Negative.    History reviewed. No pertinent past medical history.  History   Social History  . Marital Status: Widowed    Spouse Name: N/A    Number of Children: N/A  . Years of Education: N/A   Occupational History  . Not on file.   Social History Main Topics  . Smoking status: Never Smoker   . Smokeless tobacco: Never Used  . Alcohol Use: 1.5 oz/week    3 drink(s) per week  . Drug Use: No  . Sexual Activity: Not on file   Other Topics Concern  . Not on file   Social History Narrative  . No narrative on file    Past Surgical History  Procedure Laterality Date  . Knee arthroscopy    . Liposuction  5/05  . Foot surgery  01/15/2012    right  . Foot surgery  July 2014    right  . Colonoscopy      Family History  Problem Relation Age of Onset  . Diabetes Father   . Hypertension Father   . Heart disease Father     CABG  . Cancer Mother 49    cancer unknown primary  . COPD Mother   . Colon cancer Neg Hx     Allergies  Allergen Reactions  . Acetaminophen Anaphylaxis    REACTION: swells throat shut/can't breathe    Current Outpatient Prescriptions on File Prior to Visit  Medication Sig Dispense Refill  . Cholecalciferol  (VITAMIN D PO) Take 1 tablet by mouth daily.      . Multiple Vitamins-Minerals (WOMENS BONE HEALTH PO) Take 1 tablet by mouth daily.      Marland Kitchen triamcinolone cream (KENALOG) 0.1 %        No current facility-administered medications on file prior to visit.    BP 118/80  Pulse 103  Wt 119 lb (53.978 kg)chart     Objective:   Physical Exam  Constitutional: She is oriented to person, place, and time. She appears well-developed and well-nourished.  Neck: Normal range of motion. Neck supple.  Cardiovascular: Normal rate, regular rhythm and normal heart sounds.   Pulmonary/Chest: Breath sounds normal.  Musculoskeletal: Normal range of motion.  Neurological: She is alert and oriented to person, place, and time.  Skin: Skin is warm.  Psychiatric: She has a normal mood and affect.          Assessment & Plan:  Jun was seen today for follow-up.  Diagnoses and associated orders for this visit:  Depression  Insomnia  Other Orders - buPROPion (WELLBUTRIN XL) 300 MG 24 hr tablet; TAKE 1 TABLET DAILY - ALPRAZolam (XANAX) 0.5 MG tablet; Take 1 tablet (0.5 mg total) by mouth daily as needed for anxiety.

## 2013-09-08 NOTE — Patient Instructions (Addendum)
Stress and Stress Management Stress is a normal reaction to life events. It is what you feel when life demands more than you are used to or more than you can handle. Some stress can be useful. For example, the stress reaction can help you catch the last bus of the day, study for a test, or meet a deadline at work. But stress that occurs too often or for too long can cause problems. It can affect your emotional health and interfere with relationships and normal daily activities. Too much stress can weaken your immune system and increase your risk for physical illness. If you already have a medical problem, stress can make it worse. CAUSES  All sorts of life events may cause stress. An event that causes stress for one person may not be stressful for another person. Major life events commonly cause stress. These may be positive or negative. Examples include losing your job, moving into a new home, getting married, having a baby, or losing a loved one. Less obvious life events may also cause stress, especially if they occur day after day or in combination. Examples include working long hours, driving in traffic, caring for children, being in debt, or being in a difficult relationship. SIGNS AND SYMPTOMS Stress may cause emotional symptoms including, the following:  Anxiety. This is feeling worried, afraid, on edge, overwhelmed, or out of control.  Anger. This is feeling irritated or impatient.  Depression. This is feeling sad, down, helpless, or guilty.  Difficulty focusing, remembering, or making decisions. Stress may cause physical symptoms, including the following:   Aches and pains. These may affect your head, neck, back, stomach, or other areas of your body.  Tight muscles or clenched jaw.  Low energy or trouble sleeping. Stress may cause unhealthy behaviors, including the following:   Eating to feel better (overeating) or skipping meals.  Sleeping too little, too much, or both.  Working  too much or putting off tasks (procrastination).  Smoking, drinking alcohol, or using drugs to feel better. DIAGNOSIS  Stress is diagnosed through an assessment by your health care provider. Your health care provider will ask questions about your symptoms and any stressful life events.Your health care provider will also ask about your medical history and may order blood tests or other tests. Certain medical conditions and medicine can cause physical symptoms similar to stress. Mental illness can cause emotional symptoms and unhealthy behaviors similar to stress. Your health care provider may refer you to a mental health professional for further evaluation.  TREATMENT  Stress management is the recommended treatment for stress.The goals of stress management are reducing stressful life events and coping with stress in healthy ways.  Techniques for reducing stressful life events include the following:  Stress identification. Self-monitor for stress and identify what causes stress for you. These skills may help you to avoid some stressful events.  Time management. Set your priorities, keep a calendar of events, and learn to say "no." These tools can help you avoid making too many commitments. Techniques for coping with stress include the following:  Rethinking the problem. Try to think realistically about stressful events rather than ignoring them or overreacting. Try to find the positives in a stressful situation rather than focusing on the negatives.  Exercise. Physical exercise can release both physical and emotional tension. The key is to find a form of exercise you enjoy and do it regularly.  Relaxation techniques. These relax the body and mind. Examples include yoga, meditation, tai chi, biofeedback, deep  breathing, progressive muscle relaxation, listening to music, being out in nature, journaling, and other hobbies. Again, the key is to find one or more that you enjoy and can do  regularly.  Healthy lifestyle. Eat a balanced diet, get plenty of sleep, and do not smoke. Avoid using alcohol or drugs to relax.  Strong support network. Spend time with family, friends, or other people you enjoy being around.Express your feelings and talk things over with someone you trust. Counseling or talktherapy with a mental health professional may be helpful if you are having difficulty managing stress on your own. Medicine is typically not recommended for the treatment of stress.Talk to your health care provider if you think you need medicine for symptoms of stress. HOME CARE INSTRUCTIONS  Keep all follow-up visits as directed by your health care provider.  Take all medicines as directed by your health care provider. SEEK MEDICAL CARE IF:  Your symptoms get worse or you start having new symptoms.  You feel overwhelmed by your problems and can no longer manage them on your own. SEEK IMMEDIATE MEDICAL CARE IF:  You feel like hurting yourself or someone else. Document Released: 07/08/2000 Document Revised: 05/29/2013 Document Reviewed: 09/06/2012 [employer] Patient Information 2015 [employer]. This information is not intended to replace advice given to you by your health care provider. Make sure you discuss any questions you have with your health care provider.   

## 2013-11-03 ENCOUNTER — Encounter: Payer: Self-pay | Admitting: Internal Medicine

## 2014-03-07 ENCOUNTER — Ambulatory Visit (INDEPENDENT_AMBULATORY_CARE_PROVIDER_SITE_OTHER): Payer: BLUE CROSS/BLUE SHIELD

## 2014-03-07 ENCOUNTER — Encounter: Payer: Self-pay | Admitting: Podiatry

## 2014-03-07 ENCOUNTER — Ambulatory Visit (INDEPENDENT_AMBULATORY_CARE_PROVIDER_SITE_OTHER): Payer: BLUE CROSS/BLUE SHIELD | Admitting: Podiatry

## 2014-03-07 VITALS — BP 137/74 | HR 72 | Resp 16

## 2014-03-07 DIAGNOSIS — M216X9 Other acquired deformities of unspecified foot: Secondary | ICD-10-CM

## 2014-03-07 DIAGNOSIS — M779 Enthesopathy, unspecified: Secondary | ICD-10-CM

## 2014-03-07 DIAGNOSIS — Q667 Congenital pes cavus: Secondary | ICD-10-CM

## 2014-03-07 MED ORDER — TRIAMCINOLONE ACETONIDE 10 MG/ML IJ SUSP
10.0000 mg | Freq: Once | INTRAMUSCULAR | Status: AC
  Administered 2014-03-07: 10 mg

## 2014-03-07 NOTE — Progress Notes (Signed)
Subjective:     Patient ID: Denise Bradley, female   DOB: 04-30-1950, 64 y.o.   MRN: [id number]  HPI patient presents stating I started to develop pain underneath my first metatarsal right. States I was better for around 10 months   Review of Systems     Objective:   Physical Exam Neurovascular status intact with pain underneath the first metatarsal head right with inflammation and fluid buildup upon palpation with good range of motion first MPJ and no indications of other pathology    Assessment:     Plantar capsulitis first MPJ right with previous tibial sesamoidectomy which has helped her immensely    Plan:     Reviewed x-rays and did careful injection of the plantar right first MPJ 3 mg done for some Kenalog 5 mg Xylocaine and advised on reduced activity for several days. Reappoint to recheck

## 2014-05-04 ENCOUNTER — Telehealth: Payer: Self-pay | Admitting: Internal Medicine

## 2014-05-04 NOTE — Telephone Encounter (Signed)
Pt has not been seen since 08/2013 and was told at that time, per AVS and OV note, to see Dr. Yong Channel for CPX. Pt did not schedule appointment. Letters were sent out to [facility] letting them know that they will need to establish care with someone else. She should follow up with a provider next week for refills of her Wellbutrin. Refill not authorized by [employer]

## 2014-05-04 NOTE — Telephone Encounter (Signed)
Denied. Pt needs appointment to est with a new provider. Still has [facility] listed and has not seen Padonda since 08/2013

## 2014-05-04 NOTE — Telephone Encounter (Addendum)
Pt needs refills on wellbutrin xr [phone]  Send to new pharm [facility] [address]

## 2014-05-04 NOTE — Telephone Encounter (Signed)
Pt has been sch with cory on 05-09-14

## 2014-05-04 NOTE — Telephone Encounter (Signed)
Pt does not have time until June to be seen. Pt only has 1 wk of med left

## 2014-05-09 ENCOUNTER — Ambulatory Visit: Payer: Self-pay | Admitting: Adult Health

## 2014-05-16 ENCOUNTER — Ambulatory Visit (INDEPENDENT_AMBULATORY_CARE_PROVIDER_SITE_OTHER): Payer: BLUE CROSS/BLUE SHIELD | Admitting: Adult Health

## 2014-05-16 ENCOUNTER — Encounter: Payer: Self-pay | Admitting: Adult Health

## 2014-05-16 VITALS — BP 98/64 | Temp 98.0°F | Ht 59.0 in | Wt 121.6 lb

## 2014-05-16 DIAGNOSIS — Z7189 Other specified counseling: Secondary | ICD-10-CM

## 2014-05-16 DIAGNOSIS — F329 Major depressive disorder, single episode, unspecified: Secondary | ICD-10-CM | POA: Diagnosis not present

## 2014-05-16 DIAGNOSIS — F32A Depression, unspecified: Secondary | ICD-10-CM

## 2014-05-16 DIAGNOSIS — Z7689 Persons encountering health services in other specified circumstances: Secondary | ICD-10-CM

## 2014-05-16 DIAGNOSIS — Z23 Encounter for immunization: Secondary | ICD-10-CM | POA: Diagnosis not present

## 2014-05-16 MED ORDER — BUPROPION HCL ER (XL) 300 MG PO TB24: ORAL_TABLET | ORAL | Status: DC

## 2014-05-16 MED ORDER — ALPRAZOLAM 0.5 MG PO TABS: 0.5000 mg | ORAL_TABLET | Freq: Every evening | ORAL | Status: DC | PRN

## 2014-05-16 NOTE — Patient Instructions (Addendum)
It was a pleasure meeting you today. Please follow up in August for a complete physical and blood work. If you need anything in the mean time, please do not hesitate to call me. Try and get outside and at least walk around the block. Not only is is good exercise but it can make a world of difference in mood.   Health Maintenance Adopting a healthy lifestyle and getting preventive care can go a long way to promote health and wellness. Talk with your health care provider about what schedule of regular examinations is right for you. This is a good chance for you to check in with your provider about disease prevention and staying healthy. In between checkups, there are plenty of things you can do on your own. Experts have done a lot of research about which lifestyle changes and preventive measures are most likely to keep you healthy. Ask your health care provider for more information. WEIGHT AND DIET  Eat a healthy diet  Be sure to include plenty of vegetables, fruits, low-fat dairy products, and lean protein.  Do not eat a lot of foods high in solid fats, added sugars, or salt.  Get regular exercise. This is one of the most important things you can do for your health.  Most adults should exercise for at least 150 minutes each week. The exercise should increase your heart rate and make you sweat (moderate-intensity exercise).  Most adults should also do strengthening exercises at least twice a week. This is in addition to the moderate-intensity exercise.  Maintain a healthy weight  Body mass index (BMI) is a measurement that can be used to identify possible weight problems. It estimates body fat based on height and weight. Your health care provider can help determine your BMI and help you achieve or maintain a healthy weight.  For females 42 years of age and older:   A BMI below 18.5 is considered underweight.  A BMI of 18.5 to 24.9 is normal.  A BMI of 25 to 29.9 is considered  overweight.  A BMI of 30 and above is considered obese.  Watch levels of cholesterol and blood lipids  You should start having your blood tested for lipids and cholesterol at 62 years of age, then have this test every 5 years.  You may need to have your cholesterol levels checked more often if:  Your lipid or cholesterol levels are high.  You are older than 62 years of age.  You are at high risk for heart disease.  CANCER SCREENING   Lung Cancer  Lung cancer screening is recommended for adults 74-30 years old who are at high risk for lung cancer because of a history of smoking.  A yearly low-dose CT scan of the lungs is recommended for people who:  Currently smoke.  Have quit within the past 15 years.  Have at least a 30-pack-year history of smoking. A pack year is smoking an average of one pack of cigarettes a day for 1 year.  Yearly screening should continue until it has been 15 years since you quit.  Yearly screening should stop if you develop a health problem that would prevent you from having lung cancer treatment.  Breast Cancer  Practice breast self-awareness. This means understanding how your breasts normally appear and feel.  It also means doing regular breast self-exams. Let your health care provider know about any changes, no matter how small.  If you are in your 20s or 30s, you should have a  clinical breast exam (CBE) by a health care provider every 1-3 years as part of a regular health exam.  If you are 2 or older, have a CBE every year. Also consider having a breast X-ray (mammogram) every year.  If you have a family history of breast cancer, talk to your health care provider about genetic screening.  If you are at high risk for breast cancer, talk to your health care provider about having an MRI and a mammogram every year.  Breast cancer gene (BRCA) assessment is recommended for women who have family members with BRCA-related cancers. BRCA-related  cancers include:  Breast.  Ovarian.  Tubal.  Peritoneal cancers.  Results of the assessment will determine the need for genetic counseling and BRCA1 and BRCA2 testing. Cervical Cancer Routine pelvic examinations to screen for cervical cancer are no longer recommended for nonpregnant women who are considered low risk for cancer of the pelvic organs (ovaries, uterus, and vagina) and who do not have symptoms. A pelvic examination may be necessary if you have symptoms including those associated with pelvic infections. Ask your health care provider if a screening pelvic exam is right for you.   The Pap test is the screening test for cervical cancer for women who are considered at risk.  If you had a hysterectomy for a problem that was not cancer or a condition that could lead to cancer, then you no longer need Pap tests.  If you are older than 65 years, and you have had normal Pap tests for the past 10 years, you no longer need to have Pap tests.  If you have had past treatment for cervical cancer or a condition that could lead to cancer, you need Pap tests and screening for cancer for at least 20 years after your treatment.  If you no longer get a Pap test, assess your risk factors if they change (such as having a new sexual partner). This can affect whether you should start being screened again.  Some women have medical problems that increase their chance of getting cervical cancer. If this is the case for you, your health care provider may recommend more frequent screening and Pap tests.  The human papillomavirus (HPV) test is another test that may be used for cervical cancer screening. The HPV test looks for the virus that can cause cell changes in the cervix. The cells collected during the Pap test can be tested for HPV.  The HPV test can be used to screen women 4 years of age and older. Getting tested for HPV can extend the interval between normal Pap tests from three to five  years.  An HPV test also should be used to screen women of any age who have unclear Pap test results.  After 62 years of age, women should have HPV testing as often as Pap tests.  Colorectal Cancer  This type of cancer can be detected and often prevented.  Routine colorectal cancer screening usually begins at 62 years of age and continues through 62 years of age.  Your health care provider may recommend screening at an earlier age if you have risk factors for colon cancer.  Your health care provider may also recommend using home test kits to check for hidden blood in the stool.  A small camera at the end of a tube can be used to examine your colon directly (sigmoidoscopy or colonoscopy). This is done to check for the earliest forms of colorectal cancer.  Routine screening usually begins at  age 22.  Direct examination of the colon should be repeated every 5-10 years through 62 years of age. However, you may need to be screened more often if early forms of precancerous polyps or small growths are found. Skin Cancer  Check your skin from head to toe regularly.  Tell your health care provider about any new moles or changes in moles, especially if there is a change in a mole's shape or color.  Also tell your health care provider if you have a mole that is larger than the size of a pencil eraser.  Always use sunscreen. Apply sunscreen liberally and repeatedly throughout the day.  Protect yourself by wearing long sleeves, pants, a wide-brimmed hat, and sunglasses whenever you are outside. HEART DISEASE, DIABETES, AND HIGH BLOOD PRESSURE   Have your blood pressure checked at least every 1-2 years. High blood pressure causes heart disease and increases the risk of stroke.  If you are between 12 years and 62 years old, ask your health care provider if you should take aspirin to prevent strokes.  Have regular diabetes screenings. This involves taking a blood sample to check your fasting  blood sugar level.  If you are at a normal weight and have a low risk for diabetes, have this test once every three years after 62 years of age.  If you are overweight and have a high risk for diabetes, consider being tested at a younger age or more often. PREVENTING INFECTION  Hepatitis B  If you have a higher risk for hepatitis B, you should be screened for this virus. You are considered at high risk for hepatitis B if:  You were born in a country where hepatitis B is common. Ask your health care provider which countries are considered high risk.  Your parents were born in a high-risk country, and you have not been immunized against hepatitis B (hepatitis B vaccine).  You have HIV or AIDS.  You use needles to inject street drugs.  You live with someone who has hepatitis B.  You have had sex with someone who has hepatitis B.  You get hemodialysis treatment.  You take certain medicines for conditions, including cancer, organ transplantation, and autoimmune conditions. Hepatitis C  Blood testing is recommended for:  Everyone born from [birth year] through 1963.  Anyone with known risk factors for hepatitis C. Sexually transmitted infections (STIs)  You should be screened for sexually transmitted infections (STIs) including gonorrhea and chlamydia if:  You are sexually active and are younger than 62 years of age.  You are older than 62 years of age and your health care provider tells you that you are at risk for this type of infection.  Your sexual activity has changed since you were last screened and you are at an increased risk for chlamydia or gonorrhea. Ask your health care provider if you are at risk.  If you do not have HIV, but are at risk, it may be recommended that you take a prescription medicine daily to prevent HIV infection. This is called pre-exposure prophylaxis (PrEP). You are considered at risk if:  You are sexually active and do not regularly use condoms or know  the HIV status of your partner(s).  You take drugs by injection.  You are sexually active with a partner who has HIV. Talk with your health care provider about whether you are at high risk of being infected with HIV. If you choose to begin PrEP, you should first be tested for HIV. You  should then be tested every 3 months for as long as you are taking PrEP.  PREGNANCY   If you are premenopausal and you may become pregnant, ask your health care provider about preconception counseling.  If you may become pregnant, take 400 to 800 micrograms (mcg) of folic acid every day.  If you want to prevent pregnancy, talk to your health care provider about birth control (contraception). OSTEOPOROSIS AND MENOPAUSE   Osteoporosis is a disease in which the bones lose minerals and strength with aging. This can result in serious bone fractures. Your risk for osteoporosis can be identified using a bone density scan.  If you are 64 years of age or older, or if you are at risk for osteoporosis and fractures, ask your health care provider if you should be screened.  Ask your health care provider whether you should take a calcium or vitamin D supplement to lower your risk for osteoporosis.  Menopause may have certain physical symptoms and risks.  Hormone replacement therapy may reduce some of these symptoms and risks. Talk to your health care provider about whether hormone replacement therapy is right for you.  HOME CARE INSTRUCTIONS   Schedule regular health, dental, and eye exams.  Stay current with your immunizations.   Do not use any tobacco products including cigarettes, chewing tobacco, or electronic cigarettes.  If you are pregnant, do not drink alcohol.  If you are breastfeeding, limit how much and how often you drink alcohol.  Limit alcohol intake to no more than 1 drink per day for nonpregnant women. One drink equals 12 ounces of beer, 5 ounces of wine, or 1 ounces of hard liquor.  Do not  use street drugs.  Do not share needles.  Ask your health care provider for help if you need support or information about quitting drugs.  Tell your health care provider if you often feel depressed.  Tell your health care provider if you have ever been abused or do not feel safe at home. Document Released: 07/28/2010 Document Revised: 05/29/2013 Document Reviewed: 12/14/2012 [facility] Patient Information 2015 [place], [place]. This information is not intended to replace advice given to you by your health care provider. Make sure you discuss any questions you have with your health care provider.

## 2014-05-16 NOTE — Progress Notes (Signed)
Patient ID: Denise Bradley, female   DOB: 1950/10/19, 64 y.o.   MRN: [id number]     HPI:  Denise Bradley is here to establish care.  Last PCP and physical: Her last physical August of 2015 with Megan Salon.  Eye Doctor: yearly Dentist: Twice a year GYN: Needs  She denies any problems at this time. Although she denies being depressed she was tearful in the exam room today. She states that she sometimes gets upset about afmily issues and not feeling close to her son and her sisters. She takes xanax on occasion for anxiety but does not use it on a regular basis.   Has the following chronic problems that require follow up and concerns today:  Depression: Even though she denies it , she was obviously depressed today. She endorses feeling "off" when she forgets to take her Wellbutrin but that as soon as she takes it she feels better.    Refills on Wellbutrin and Xanax. She takes the Xanax as needed. Has been taking Wellbutrin since 2013. Only feels depressed when she doesn't take her Wellbutrin     ROS negative for unless reported above: fevers, unintentional weight loss, hearing or vision loss, chest pain, palpitations, struggling to breath, hemoptysis, melena, hematochezia, hematuria, falls, loc, si, thoughts of self harm  Past Medical History  - Depression   Past Surgical History  Procedure Laterality Date  . Knee arthroscopy    . Liposuction  5/05  . Foot surgery  01/15/2012    right  . Foot surgery  July 2014    right  . Colonoscopy      Family History  Problem Relation Age of Onset  . Diabetes Father   . Hypertension Father   . Heart disease Father     CABG  . Cancer Mother 66    cancer unknown primary  . COPD Mother   . Colon cancer Neg Hx     History   Social History  . Marital Status: Widowed    Spouse Name: N/A  . Number of Children: N/A  . Years of Education: N/A   Social History Main Topics  . Smoking status: Never Smoker   . Smokeless  tobacco: Never Used  . Alcohol Use: 1.5 oz/week    3 drink(s) per week  . Drug Use: No  . Sexual Activity: Not on file   Other Topics Concern  . None   Social History Narrative   Retired - Worked with [employer] [employer] and last May she was laid off. She worked there for 22 years   Going back to school for [employer].    No pets   Three children ( [place], [place]. [place] ( Son and two daughters- all married. & grandchildren)   States " Does not do anything fun".          Diet: Eat healthy, does not eat fast food.    Exercise: Does not exercise, does not feel like she has time.            Current outpatient prescriptions:  .  ALPRAZolam (XANAX) 0.5 MG tablet, Take 1 tablet (0.5 mg total) by mouth at bedtime as needed for anxiety., Disp: 30 tablet, Rfl: 0 .  buPROPion (WELLBUTRIN XL) 300 MG 24 hr tablet, TAKE 1 TABLET DAILY, Disp: 90 tablet, Rfl: 1 .  Cholecalciferol (VITAMIN D PO), Take 1 tablet by mouth daily., Disp: , Rfl:  .  Multiple Vitamins-Minerals (WOMENS BONE HEALTH PO), Take 1 tablet by mouth daily.,  Disp: , Rfl:  .  triamcinolone cream (KENALOG) 0.1 %, , Disp: , Rfl:   EXAM:  Filed Vitals:   05/16/14 1005  BP: 98/64  Temp: 98 F (36.7 C)    Body mass index is 24.55 kg/(m^2).  GENERAL: vitals reviewed and listed above, alert, oriented, appears well hydrated. She appears sad and on occasion has a harsh tone.   HEENT: atraumatic, conjunttiva clear, no obvious abnormalities on inspection of external nose and ears  NECK: no obvious masses on inspection  LUNGS: clear to auscultation bilaterally, no wheezes, rales or rhonchi, good air movement  CV: HRRR, no peripheral edema. No carotid bruit.   MS: moves all extremities without noticeable abnormality  PSYCH: pleasant and cooperative, obvious depression. No  anxiety  ASSESSMENT AND PLAN:  Discussed the following assessment and plan:  1. Encounter to establish care - Will follow up for Complete Physical in  August - Tetanus given  - Continue to eat healthy diet and start an exercise regimen.    2. Depression - Refilled Xanax and Welbutrin - Spoke at length about starting a counseling program. She was not originally responsive to this plan, but after speaking with her for awhile she become more open to the idea. I gave her a list of the therapists at [facility] and advised her to look at them on the web site and when she feels ready to make an appointment.  - If she feels severely depressed and has SI, she needs to report to the ER.  - Follow up in August for CPX, sooner if needed   -We reviewed the PMH, PSH, FH, SH, Meds and Allergies. -We provided refills for any medications we will prescribe as needed. -We addressed current concerns per orders and patient instructions. -We have advised patient to follow up per instructions below.   -Patient advised to return or notify a provider immediately if symptoms worsen or persist or new concerns arise.  Patient Instructions  It was a pleasure meeting you today. Please follow up in August for a complete physical and blood work. If you need anything in the mean time, please do not hesitate to call me. Try and get outside and at least walk around the block. Not only is is good exercise but it can make a world of difference in mood.   Health Maintenance Adopting a healthy lifestyle and getting preventive care can go a long way to promote health and wellness. Talk with your health care provider about what schedule of regular examinations is right for you. This is a good chance for you to check in with your provider about disease prevention and staying healthy. In between checkups, there are plenty of things you can do on your own. Experts have done a lot of research about which lifestyle changes and preventive measures are most likely to keep you healthy. Ask your health care provider for more information. WEIGHT AND DIET  Eat a healthy diet  Be sure to  include plenty of vegetables, fruits, low-fat dairy products, and lean protein.  Do not eat a lot of foods high in solid fats, added sugars, or salt.  Get regular exercise. This is one of the most important things you can do for your health.  Most adults should exercise for at least 150 minutes each week. The exercise should increase your heart rate and make you sweat (moderate-intensity exercise).  Most adults should also do strengthening exercises at least twice a week. This is in addition to the  moderate-intensity exercise.  Maintain a healthy weight  Body mass index (BMI) is a measurement that can be used to identify possible weight problems. It estimates body fat based on height and weight. Your health care provider can help determine your BMI and help you achieve or maintain a healthy weight.  For females 60 years of age and older:   A BMI below 18.5 is considered underweight.  A BMI of 18.5 to 24.9 is normal.  A BMI of 25 to 29.9 is considered overweight.  A BMI of 30 and above is considered obese.  Watch levels of cholesterol and blood lipids  You should start having your blood tested for lipids and cholesterol at 62 years of age, then have this test every 5 years.  You may need to have your cholesterol levels checked more often if:  Your lipid or cholesterol levels are high.  You are older than 62 years of age.  You are at high risk for heart disease.  CANCER SCREENING   Lung Cancer  Lung cancer screening is recommended for adults 33-64 years old who are at high risk for lung cancer because of a history of smoking.  A yearly low-dose CT scan of the lungs is recommended for people who:  Currently smoke.  Have quit within the past 15 years.  Have at least a 30-pack-year history of smoking. A pack year is smoking an average of one pack of cigarettes a day for 1 year.  Yearly screening should continue until it has been 15 years since you quit.  Yearly  screening should stop if you develop a health problem that would prevent you from having lung cancer treatment.  Breast Cancer  Practice breast self-awareness. This means understanding how your breasts normally appear and feel.  It also means doing regular breast self-exams. Let your health care provider know about any changes, no matter how small.  If you are in your 20s or 30s, you should have a clinical breast exam (CBE) by a health care provider every 1-3 years as part of a regular health exam.  If you are 27 or older, have a CBE every year. Also consider having a breast X-ray (mammogram) every year.  If you have a family history of breast cancer, talk to your health care provider about genetic screening.  If you are at high risk for breast cancer, talk to your health care provider about having an MRI and a mammogram every year.  Breast cancer gene (BRCA) assessment is recommended for women who have family members with BRCA-related cancers. BRCA-related cancers include:  Breast.  Ovarian.  Tubal.  Peritoneal cancers.  Results of the assessment will determine the need for genetic counseling and BRCA1 and BRCA2 testing. Cervical Cancer Routine pelvic examinations to screen for cervical cancer are no longer recommended for nonpregnant women who are considered low risk for cancer of the pelvic organs (ovaries, uterus, and vagina) and who do not have symptoms. A pelvic examination may be necessary if you have symptoms including those associated with pelvic infections. Ask your health care provider if a screening pelvic exam is right for you.   The Pap test is the screening test for cervical cancer for women who are considered at risk.  If you had a hysterectomy for a problem that was not cancer or a condition that could lead to cancer, then you no longer need Pap tests.  If you are older than 65 years, and you have had normal Pap tests for the  past 10 years, you no longer need to  have Pap tests.  If you have had past treatment for cervical cancer or a condition that could lead to cancer, you need Pap tests and screening for cancer for at least 20 years after your treatment.  If you no longer get a Pap test, assess your risk factors if they change (such as having a new sexual partner). This can affect whether you should start being screened again.  Some women have medical problems that increase their chance of getting cervical cancer. If this is the case for you, your health care provider may recommend more frequent screening and Pap tests.  The human papillomavirus (HPV) test is another test that may be used for cervical cancer screening. The HPV test looks for the virus that can cause cell changes in the cervix. The cells collected during the Pap test can be tested for HPV.  The HPV test can be used to screen women 96 years of age and older. Getting tested for HPV can extend the interval between normal Pap tests from three to five years.  An HPV test also should be used to screen women of any age who have unclear Pap test results.  After 62 years of age, women should have HPV testing as often as Pap tests.  Colorectal Cancer  This type of cancer can be detected and often prevented.  Routine colorectal cancer screening usually begins at 62 years of age and continues through 62 years of age.  Your health care provider may recommend screening at an earlier age if you have risk factors for colon cancer.  Your health care provider may also recommend using home test kits to check for hidden blood in the stool.  A small camera at the end of a tube can be used to examine your colon directly (sigmoidoscopy or colonoscopy). This is done to check for the earliest forms of colorectal cancer.  Routine screening usually begins at age 76.  Direct examination of the colon should be repeated every 5-10 years through 62 years of age. However, you may need to be screened more  often if early forms of precancerous polyps or small growths are found. Skin Cancer  Check your skin from head to toe regularly.  Tell your health care provider about any new moles or changes in moles, especially if there is a change in a mole's shape or color.  Also tell your health care provider if you have a mole that is larger than the size of a pencil eraser.  Always use sunscreen. Apply sunscreen liberally and repeatedly throughout the day.  Protect yourself by wearing long sleeves, pants, a wide-brimmed hat, and sunglasses whenever you are outside. HEART DISEASE, DIABETES, AND HIGH BLOOD PRESSURE   Have your blood pressure checked at least every 1-2 years. High blood pressure causes heart disease and increases the risk of stroke.  If you are between 91 years and 3 years old, ask your health care provider if you should take aspirin to prevent strokes.  Have regular diabetes screenings. This involves taking a blood sample to check your fasting blood sugar level.  If you are at a normal weight and have a low risk for diabetes, have this test once every three years after 62 years of age.  If you are overweight and have a high risk for diabetes, consider being tested at a younger age or more often. PREVENTING INFECTION  Hepatitis B  If you have a higher risk for  hepatitis B, you should be screened for this virus. You are considered at high risk for hepatitis B if:  You were born in a country where hepatitis B is common. Ask your health care provider which countries are considered high risk.  Your parents were born in a high-risk country, and you have not been immunized against hepatitis B (hepatitis B vaccine).  You have HIV or AIDS.  You use needles to inject street drugs.  You live with someone who has hepatitis B.  You have had sex with someone who has hepatitis B.  You get hemodialysis treatment.  You take certain medicines for conditions, including cancer, organ  transplantation, and autoimmune conditions. Hepatitis C  Blood testing is recommended for:  Everyone born from [birth year] through 1963.  Anyone with known risk factors for hepatitis C. Sexually transmitted infections (STIs)  You should be screened for sexually transmitted infections (STIs) including gonorrhea and chlamydia if:  You are sexually active and are younger than 62 years of age.  You are older than 62 years of age and your health care provider tells you that you are at risk for this type of infection.  Your sexual activity has changed since you were last screened and you are at an increased risk for chlamydia or gonorrhea. Ask your health care provider if you are at risk.  If you do not have HIV, but are at risk, it may be recommended that you take a prescription medicine daily to prevent HIV infection. This is called pre-exposure prophylaxis (PrEP). You are considered at risk if:  You are sexually active and do not regularly use condoms or know the HIV status of your partner(s).  You take drugs by injection.  You are sexually active with a partner who has HIV. Talk with your health care provider about whether you are at high risk of being infected with HIV. If you choose to begin PrEP, you should first be tested for HIV. You should then be tested every 3 months for as long as you are taking PrEP.  PREGNANCY   If you are premenopausal and you may become pregnant, ask your health care provider about preconception counseling.  If you may become pregnant, take 400 to 800 micrograms (mcg) of folic acid every day.  If you want to prevent pregnancy, talk to your health care provider about birth control (contraception). OSTEOPOROSIS AND MENOPAUSE   Osteoporosis is a disease in which the bones lose minerals and strength with aging. This can result in serious bone fractures. Your risk for osteoporosis can be identified using a bone density scan.  If you are 5 years of age or older,  or if you are at risk for osteoporosis and fractures, ask your health care provider if you should be screened.  Ask your health care provider whether you should take a calcium or vitamin D supplement to lower your risk for osteoporosis.  Menopause may have certain physical symptoms and risks.  Hormone replacement therapy may reduce some of these symptoms and risks. Talk to your health care provider about whether hormone replacement therapy is right for you.  HOME CARE INSTRUCTIONS   Schedule regular health, dental, and eye exams.  Stay current with your immunizations.   Do not use any tobacco products including cigarettes, chewing tobacco, or electronic cigarettes.  If you are pregnant, do not drink alcohol.  If you are breastfeeding, limit how much and how often you drink alcohol.  Limit alcohol intake to no more than 1  drink per day for nonpregnant women. One drink equals 12 ounces of beer, 5 ounces of wine, or 1 ounces of hard liquor.  Do not use street drugs.  Do not share needles.  Ask your health care provider for help if you need support or information about quitting drugs.  Tell your health care provider if you often feel depressed.  Tell your health care provider if you have ever been abused or do not feel safe at home. Document Released: 07/28/2010 Document Revised: 05/29/2013 Document Reviewed: 12/14/2012 [facility] Patient Information 2015 [place], [place]. This information is not intended to replace advice given to you by your health care provider. Make sure you discuss any questions you have with your health care provider.      [employer]

## 2014-05-16 NOTE — Progress Notes (Signed)
Pre visit review using our clinic review tool, if applicable. No additional management support is needed unless otherwise documented below in the visit note. 

## 2014-05-18 ENCOUNTER — Telehealth: Payer: Self-pay | Admitting: Adult Health

## 2014-05-18 MED ORDER — BUPROPION HCL ER (XL) 300 MG PO TB24: ORAL_TABLET | ORAL | Status: DC

## 2014-05-18 NOTE — Telephone Encounter (Signed)
Pt was seen on 05/16/14. Pt would like bupropion #90 w/refills  [facility] [address] [facility]

## 2014-05-18 NOTE — Telephone Encounter (Signed)
Rx resent to [facility] [address]

## 2014-09-24 ENCOUNTER — Other Ambulatory Visit: Payer: Self-pay | Admitting: Adult Health

## 2014-09-24 DIAGNOSIS — Z1231 Encounter for screening mammogram for malignant neoplasm of breast: Secondary | ICD-10-CM

## 2014-09-26 ENCOUNTER — Ambulatory Visit (HOSPITAL_COMMUNITY)
Admission: RE | Admit: 2014-09-26 | Discharge: 2014-09-26 | Disposition: A | Discharge status: Home / Self Care | Payer: BLUE CROSS/BLUE SHIELD | Source: Ambulatory Visit | Attending: Adult Health | Admitting: Adult Health

## 2014-09-26 DIAGNOSIS — Z1231 Encounter for screening mammogram for malignant neoplasm of breast: Secondary | ICD-10-CM

## 2015-08-02 ENCOUNTER — Other Ambulatory Visit: Payer: Self-pay | Admitting: *Deleted

## 2015-08-02 MED ORDER — BUPROPION HCL ER (XL) 300 MG PO TB24: ORAL_TABLET | ORAL | Status: DC

## 2015-09-12 ENCOUNTER — Other Ambulatory Visit: Payer: Self-pay | Admitting: Adult Health

## 2015-09-12 DIAGNOSIS — Z1231 Encounter for screening mammogram for malignant neoplasm of breast: Secondary | ICD-10-CM

## 2015-10-02 ENCOUNTER — Ambulatory Visit
Admission: RE | Admit: 2015-10-02 | Discharge: 2015-10-02 | Disposition: A | Discharge status: Home / Self Care | Payer: BLUE CROSS/BLUE SHIELD | Source: Ambulatory Visit | Attending: Adult Health | Admitting: Adult Health

## 2015-10-02 DIAGNOSIS — Z1231 Encounter for screening mammogram for malignant neoplasm of breast: Secondary | ICD-10-CM

## 2015-10-03 ENCOUNTER — Other Ambulatory Visit (INDEPENDENT_AMBULATORY_CARE_PROVIDER_SITE_OTHER): Payer: BLUE CROSS/BLUE SHIELD

## 2015-10-03 DIAGNOSIS — Z0000 Encounter for general adult medical examination without abnormal findings: Secondary | ICD-10-CM | POA: Diagnosis not present

## 2015-10-03 LAB — CBC WITH DIFFERENTIAL/PLATELET
BASOS PCT: 0.6 % (ref 0.0–3.0)
Basophils Absolute: 0 10*3/uL (ref 0.0–0.1)
EOS PCT: 2.4 % (ref 0.0–5.0)
Eosinophils Absolute: 0.2 10*3/uL (ref 0.0–0.7)
HEMATOCRIT: 42.6 % (ref 36.0–46.0)
HEMOGLOBIN: 14.1 g/dL (ref 12.0–15.0)
Lymphocytes Relative: 40.2 % (ref 12.0–46.0)
Lymphs Abs: 2.7 10*3/uL (ref 0.7–4.0)
MCHC: 33.1 g/dL (ref 30.0–36.0)
MCV: 88.4 fl (ref 78.0–100.0)
MONOS PCT: 9.2 % (ref 3.0–12.0)
Monocytes Absolute: 0.6 10*3/uL (ref 0.1–1.0)
Neutro Abs: 3.2 10*3/uL (ref 1.4–7.7)
Neutrophils Relative %: 47.6 % (ref 43.0–77.0)
Platelets: 237 10*3/uL (ref 150.0–400.0)
RBC: 4.82 Mil/uL (ref 3.87–5.11)
RDW: 13.6 % (ref 11.5–15.5)
WBC: 6.7 10*3/uL (ref 4.0–10.5)

## 2015-10-03 LAB — BASIC METABOLIC PANEL
BUN: 13 mg/dL (ref 6–23)
CHLORIDE: 105 meq/L (ref 96–112)
CO2: 31 mEq/L (ref 19–32)
CREATININE: 0.95 mg/dL (ref 0.40–1.20)
Calcium: 9.3 mg/dL (ref 8.4–10.5)
GFR: 63.02 mL/min (ref >60.00)
GLUCOSE: 78 mg/dL (ref 70–99)
POTASSIUM: 4.2 meq/L (ref 3.5–5.1)
Sodium: 141 mEq/L (ref 135–145)

## 2015-10-03 LAB — POC URINALSYSI DIPSTICK (AUTOMATED)
Bilirubin, UA: NEGATIVE
GLUCOSE UA: NEGATIVE
KETONES UA: NEGATIVE
Leukocytes, UA: NEGATIVE
Nitrite, UA: NEGATIVE
Protein, UA: NEGATIVE
RBC UA: NEGATIVE
SPEC GRAV UA: 1.01
Urobilinogen, UA: 0.2
pH, UA: 7.5

## 2015-10-03 LAB — LIPID PANEL
CHOL/HDL RATIO: 3
Cholesterol: 243 mg/dL — ABNORMAL HIGH (ref 0–200)
HDL: 90.2 mg/dL (ref >39.00)
LDL CALC: 135 mg/dL — AB (ref 0–99)
NONHDL: 152.73
TRIGLYCERIDES: 89 mg/dL (ref 0.0–149.0)
VLDL: 17.8 mg/dL (ref 0.0–40.0)

## 2015-10-03 LAB — HEPATIC FUNCTION PANEL
ALBUMIN: 4.5 g/dL (ref 3.5–5.2)
ALT: 20 U/L (ref 0–35)
AST: 22 U/L (ref 0–37)
Alkaline Phosphatase: 81 U/L (ref 39–117)
BILIRUBIN TOTAL: 0.7 mg/dL (ref 0.2–1.2)
Bilirubin, Direct: 0.1 mg/dL (ref 0.0–0.3)
TOTAL PROTEIN: 7.2 g/dL (ref 6.0–8.3)

## 2015-10-03 LAB — TSH: TSH: 3.62 u[IU]/mL (ref 0.35–4.50)

## 2015-10-03 LAB — T3, FREE: T3, Free: 2.6 pg/mL (ref 2.3–4.2)

## 2015-10-03 LAB — T4, FREE: Free T4: 0.8 ng/dL (ref 0.60–1.60)

## 2015-10-09 ENCOUNTER — Ambulatory Visit (INDEPENDENT_AMBULATORY_CARE_PROVIDER_SITE_OTHER): Payer: BLUE CROSS/BLUE SHIELD | Admitting: Adult Health

## 2015-10-09 ENCOUNTER — Encounter: Payer: Self-pay | Admitting: Adult Health

## 2015-10-09 VITALS — BP 126/80 | Ht 59.0 in | Wt 123.3 lb

## 2015-10-09 DIAGNOSIS — Z0000 Encounter for general adult medical examination without abnormal findings: Secondary | ICD-10-CM | POA: Diagnosis not present

## 2015-10-09 DIAGNOSIS — Z23 Encounter for immunization: Secondary | ICD-10-CM

## 2015-10-09 DIAGNOSIS — E785 Hyperlipidemia, unspecified: Secondary | ICD-10-CM

## 2015-10-09 NOTE — Progress Notes (Signed)
Subjective:    Patient ID: Denise Bradley, female    DOB: 1951/01/18, 65 y.o.   MRN: [id number]  HPI  Patient presents for yearly preventative medicine examination. She is a pleasant 65 year old female who  has a past medical history of Depression and Hyperlipidemia.  All immunizations and health maintenance protocols were reviewed with the patient and needed orders were placed.  Medication reconciliation,  past medical history, social history, problem list and allergies were reviewed in detail with the patient  Goals were established with regard to weight loss, exercise, and  diet in compliance with medications. She eats healthy and exercises on a daily basis.   She is up to date on her mammogram, dental visits, and eye exam. She does self breast exams at home. She will see her GYN for her pap smear.   Review of Systems  Constitutional: Negative.   HENT: Negative.   Eyes: Negative.   Respiratory: Negative.   Cardiovascular: Negative.   Gastrointestinal: Negative.   Endocrine: Negative.   Genitourinary: Negative.   Musculoskeletal: Positive for arthralgias (chronic ).  Skin: Negative.   Allergic/Immunologic: Negative.   Neurological: Negative.   Hematological: Negative.   Psychiatric/Behavioral: Negative.    Past Medical History:  Diagnosis Date  . Depression   . Hyperlipidemia     Social History   Social History  . Marital status: Widowed    Spouse name: N/A  . Number of children: N/A  . Years of education: N/A   Occupational History  . Not on file.   Social History Main Topics  . Smoking status: Never Smoker  . Smokeless tobacco: Never Used  . Alcohol use 1.5 oz/week    3 drink(s) per week  . Drug use: No  . Sexual activity: Not on file   Other Topics Concern  . Not on file   Social History Narrative   Retired - Worked with [employer] and last May she was laid off. She worked there for 22 years   Going back to school for [employer].    No pets     Three children ( [place], [place]. [place] ( Son and two daughters- all married. & grandchildren)   States " Does not do anything fun".          Diet: Eat healthy, does not eat fast food.    Exercise: Does not exercise, does not feel like she has time.           Past Surgical History:  Procedure Laterality Date  . COLONOSCOPY    . FOOT SURGERY  01/15/2012   right  . FOOT SURGERY  July 2014   right  . KNEE ARTHROSCOPY    . LIPOSUCTION  5/05    Family History  Problem Relation Age of Onset  . Diabetes Father   . Hypertension Father   . Heart disease Father     CABG  . Cancer Mother 80    cancer unknown primary  . COPD Mother   . Colon cancer Neg Hx     Allergies  Allergen Reactions  . Acetaminophen Anaphylaxis    REACTION: swells throat shut/can't breathe    Current Outpatient Prescriptions on File Prior to Visit  Medication Sig Dispense Refill  . ALPRAZolam (XANAX) 0.5 MG tablet Take 1 tablet (0.5 mg total) by mouth at bedtime as needed for anxiety. 30 tablet 0  . buPROPion (WELLBUTRIN XL) 300 MG 24 hr tablet TAKE 1 TABLET DAILY 90 tablet 0  .  Multiple Vitamins-Minerals (WOMENS BONE HEALTH PO) Take 1 tablet by mouth daily.    Marland Kitchen triamcinolone cream (KENALOG) 0.1 %     . Cholecalciferol (VITAMIN D PO) Take 1 tablet by mouth daily.     No current facility-administered medications on file prior to visit.     BP 126/80   Ht 4\' 11"  (1.499 m)   Wt 123 lb 4.8 oz (55.9 kg)   BMI 24.90 kg/m       Objective:   Physical Exam  Constitutional: She is oriented to person, place, and time. She appears well-developed and well-nourished. No distress.  HENT:  Head: Normocephalic and atraumatic.  Right Ear: External ear normal.  Left Ear: External ear normal.  Nose: Nose normal.  Mouth/Throat: Oropharynx is clear and moist. No oropharyngeal exudate.  Eyes: Conjunctivae and EOM are normal. Pupils are equal, round, and reactive to light. Right eye exhibits no discharge.  Left eye exhibits no discharge. No scleral icterus.  Neck: Normal range of motion. Neck supple. No JVD present. Carotid bruit is not present. No tracheal deviation present. No thyromegaly present.  Cardiovascular: Normal rate, regular rhythm, normal heart sounds and intact distal pulses.  Exam reveals no gallop and no friction rub.   No murmur heard. Pulmonary/Chest: Effort normal and breath sounds normal. No stridor. No respiratory distress. She has no wheezes. She has no rales. She exhibits no tenderness.  Abdominal: Soft. Normal appearance and bowel sounds are normal. She exhibits no distension and no mass. There is no tenderness. There is no rebound and no guarding.  Musculoskeletal: Normal range of motion. She exhibits no edema, tenderness or deformity.  Lymphadenopathy:    She has no cervical adenopathy.  Neurological: She is alert and oriented to person, place, and time. She has normal reflexes. She displays normal reflexes. No cranial nerve deficit. She exhibits normal muscle tone. Coordination normal.  Skin: Skin is warm and dry. No rash noted. No erythema. No pallor.  Surgical scar on left foot  Psychiatric: She has a normal mood and affect. Her behavior is normal. Judgment and thought content normal.  Nursing note and vitals reviewed.     Assessment & Plan:  1. Routine general medical examination at a health care facility - Reviewed labs with patient in detail and all questions answered.  - Encouraged heart healthy diet and exercise - Follow up in one year or sooner if needed - 2. Need for influenza vaccination  - Flu Vaccine QUAD 36+ mos PF IM (Fluarix & Fluzone Quad PF)  3. Hyperlipidemia -  Total cholesterol and LDL continues to be elevated - HDL is high - 10 year cardiac risk is 3.8 %. No need to start statin Lab Results  Component Value Date   CHOL 243 (H) 10/03/2015   HDL 90.20 10/03/2015   LDLCALC 135 (H) 10/03/2015   LDLDIRECT 126.6 09/24/2011   TRIG 89.0  10/03/2015   CHOLHDL 3 10/03/2015    Dorothyann Peng, NP

## 2015-10-09 NOTE — Patient Instructions (Addendum)
It was great seeing you today  Your exam was great!  Cholesterol was a little high but changes in diet and exercise would change this  Follow up in one year or sooner if needed  Health Maintenance, Female Adopting a healthy lifestyle and getting preventive care can go a long way to promote health and wellness. Talk with your health care provider about what schedule of regular examinations is right for you. This is a good chance for you to check in with your provider about disease prevention and staying healthy. In between checkups, there are plenty of things you can do on your own. Experts have done a lot of research about which lifestyle changes and preventive measures are most likely to keep you healthy. Ask your health care provider for more information. WEIGHT AND DIET  Eat a healthy diet  Be sure to include plenty of vegetables, fruits, low-fat dairy products, and lean protein.  Do not eat a lot of foods high in solid fats, added sugars, or salt.  Get regular exercise. This is one of the most important things you can do for your health.  Most adults should exercise for at least 150 minutes each week. The exercise should increase your heart rate and make you sweat (moderate-intensity exercise).  Most adults should also do strengthening exercises at least twice a week. This is in addition to the moderate-intensity exercise.  Maintain a healthy weight  Body mass index (BMI) is a measurement that can be used to identify possible weight problems. It estimates body fat based on height and weight. Your health care provider can help determine your BMI and help you achieve or maintain a healthy weight.  For females 45 years of age and older:   A BMI below 18.5 is considered underweight.  A BMI of 18.5 to 24.9 is normal.  A BMI of 25 to 29.9 is considered overweight.  A BMI of 30 and above is considered obese.  Watch levels of cholesterol and blood lipids  You should start having  your blood tested for lipids and cholesterol at 63 years of age, then have this test every 5 years.  You may need to have your cholesterol levels checked more often if:  Your lipid or cholesterol levels are high.  You are older than 63 years of age.  You are at high risk for heart disease.  CANCER SCREENING   Lung Cancer  Lung cancer screening is recommended for adults 53-67 years old who are at high risk for lung cancer because of a history of smoking.  A yearly low-dose CT scan of the lungs is recommended for people who:  Currently smoke.  Have quit within the past 15 years.  Have at least a 30-pack-year history of smoking. A pack year is smoking an average of one pack of cigarettes a day for 1 year.  Yearly screening should continue until it has been 15 years since you quit.  Yearly screening should stop if you develop a health problem that would prevent you from having lung cancer treatment.  Breast Cancer  Practice breast self-awareness. This means understanding how your breasts normally appear and feel.  It also means doing regular breast self-exams. Let your health care provider know about any changes, no matter how small.  If you are in your 20s or 30s, you should have a clinical breast exam (CBE) by a health care provider every 1-3 years as part of a regular health exam.  If you are 40 or  older, have a CBE every year. Also consider having a breast X-ray (mammogram) every year.  If you have a family history of breast cancer, talk to your health care provider about genetic screening.  If you are at high risk for breast cancer, talk to your health care provider about having an MRI and a mammogram every year.  Breast cancer gene (BRCA) assessment is recommended for women who have family members with BRCA-related cancers. BRCA-related cancers include:  Breast.  Ovarian.  Tubal.  Peritoneal cancers.  Results of the assessment will determine the need for genetic  counseling and BRCA1 and BRCA2 testing. Cervical Cancer Your health care provider may recommend that you be screened regularly for cancer of the pelvic organs (ovaries, uterus, and vagina). This screening involves a pelvic examination, including checking for microscopic changes to the surface of your cervix (Pap test). You may be encouraged to have this screening done every 3 years, beginning at age 55.  For women ages 72-65, health care providers may recommend pelvic exams and Pap testing every 3 years, or they may recommend the Pap and pelvic exam, combined with testing for human papilloma virus (HPV), every 5 years. Some types of HPV increase your risk of cervical cancer. Testing for HPV may also be done on women of any age with unclear Pap test results.  Other health care providers may not recommend any screening for nonpregnant women who are considered low risk for pelvic cancer and who do not have symptoms. Ask your health care provider if a screening pelvic exam is right for you.  If you have had past treatment for cervical cancer or a condition that could lead to cancer, you need Pap tests and screening for cancer for at least 20 years after your treatment. If Pap tests have been discontinued, your risk factors (such as having a new sexual partner) need to be reassessed to determine if screening should resume. Some women have medical problems that increase the chance of getting cervical cancer. In these cases, your health care provider may recommend more frequent screening and Pap tests. Colorectal Cancer  This type of cancer can be detected and often prevented.  Routine colorectal cancer screening usually begins at 63 years of age and continues through 63 years of age.  Your health care provider may recommend screening at an earlier age if you have risk factors for colon cancer.  Your health care provider may also recommend using home test kits to check for hidden blood in the stool.  A  small camera at the end of a tube can be used to examine your colon directly (sigmoidoscopy or colonoscopy). This is done to check for the earliest forms of colorectal cancer.  Routine screening usually begins at age 40.  Direct examination of the colon should be repeated every 5-10 years through 63 years of age. However, you may need to be screened more often if early forms of precancerous polyps or small growths are found. Skin Cancer  Check your skin from head to toe regularly.  Tell your health care provider about any new moles or changes in moles, especially if there is a change in a mole's shape or color.  Also tell your health care provider if you have a mole that is larger than the size of a pencil eraser.  Always use sunscreen. Apply sunscreen liberally and repeatedly throughout the day.  Protect yourself by wearing long sleeves, pants, a wide-brimmed hat, and sunglasses whenever you are outside. HEART DISEASE, DIABETES,  AND HIGH BLOOD PRESSURE   High blood pressure causes heart disease and increases the risk of stroke. High blood pressure is more likely to develop in:  People who have blood pressure in the high end of the normal range (130-139/85-89 mm Hg).  People who are overweight or obese.  People who are African American.  If you are 19-7 years of age, have your blood pressure checked every 3-5 years. If you are 78 years of age or older, have your blood pressure checked every year. You should have your blood pressure measured twice--once when you are at a hospital or clinic, and once when you are not at a hospital or clinic. Record the average of the two measurements. To check your blood pressure when you are not at a hospital or clinic, you can use:  An automated blood pressure machine at a pharmacy.  A home blood pressure monitor.  If you are between 55 years and 34 years old, ask your health care provider if you should take aspirin to prevent strokes.  Have  regular diabetes screenings. This involves taking a blood sample to check your fasting blood sugar level.  If you are at a normal weight and have a low risk for diabetes, have this test once every three years after 63 years of age.  If you are overweight and have a high risk for diabetes, consider being tested at a younger age or more often. PREVENTING INFECTION  Hepatitis B  If you have a higher risk for hepatitis B, you should be screened for this virus. You are considered at high risk for hepatitis B if:  You were born in a country where hepatitis B is common. Ask your health care provider which countries are considered high risk.  Your parents were born in a high-risk country, and you have not been immunized against hepatitis B (hepatitis B vaccine).  You have HIV or AIDS.  You use needles to inject street drugs.  You live with someone who has hepatitis B.  You have had sex with someone who has hepatitis B.  You get hemodialysis treatment.  You take certain medicines for conditions, including cancer, organ transplantation, and autoimmune conditions. Hepatitis C  Blood testing is recommended for:  Everyone born from [birth year] through 1963.  Anyone with known risk factors for hepatitis C. Sexually transmitted infections (STIs)  You should be screened for sexually transmitted infections (STIs) including gonorrhea and chlamydia if:  You are sexually active and are younger than 63 years of age.  You are older than 63 years of age and your health care provider tells you that you are at risk for this type of infection.  Your sexual activity has changed since you were last screened and you are at an increased risk for chlamydia or gonorrhea. Ask your health care provider if you are at risk.  If you do not have HIV, but are at risk, it may be recommended that you take a prescription medicine daily to prevent HIV infection. This is called pre-exposure prophylaxis (PrEP). You are  considered at risk if:  You are sexually active and do not regularly use condoms or know the HIV status of your partner(s).  You take drugs by injection.  You are sexually active with a partner who has HIV. Talk with your health care provider about whether you are at high risk of being infected with HIV. If you choose to begin PrEP, you should first be tested for HIV. You should then be  tested every 3 months for as long as you are taking PrEP.  PREGNANCY   If you are premenopausal and you may become pregnant, ask your health care provider about preconception counseling.  If you may become pregnant, take 400 to 800 micrograms (mcg) of folic acid every day.  If you want to prevent pregnancy, talk to your health care provider about birth control (contraception). OSTEOPOROSIS AND MENOPAUSE   Osteoporosis is a disease in which the bones lose minerals and strength with aging. This can result in serious bone fractures. Your risk for osteoporosis can be identified using a bone density scan.  If you are 45 years of age or older, or if you are at risk for osteoporosis and fractures, ask your health care provider if you should be screened.  Ask your health care provider whether you should take a calcium or vitamin D supplement to lower your risk for osteoporosis.  Menopause may have certain physical symptoms and risks.  Hormone replacement therapy may reduce some of these symptoms and risks. Talk to your health care provider about whether hormone replacement therapy is right for you.  HOME CARE INSTRUCTIONS   Schedule regular health, dental, and eye exams.  Stay current with your immunizations.   Do not use any tobacco products including cigarettes, chewing tobacco, or electronic cigarettes.  If you are pregnant, do not drink alcohol.  If you are breastfeeding, limit how much and how often you drink alcohol.  Limit alcohol intake to no more than 1 drink per day for nonpregnant women. One  drink equals 12 ounces of beer, 5 ounces of wine, or 1 ounces of hard liquor.  Do not use street drugs.  Do not share needles.  Ask your health care provider for help if you need support or information about quitting drugs.  Tell your health care provider if you often feel depressed.  Tell your health care provider if you have ever been abused or do not feel safe at home.   This information is not intended to replace advice given to you by your health care provider. Make sure you discuss any questions you have with your health care provider.   Document Released: 07/28/2010 Document Revised: 02/02/2014 Document Reviewed: 12/14/2012 Elsevier Interactive Patient Education [employer].

## 2015-10-15 ENCOUNTER — Ambulatory Visit (INDEPENDENT_AMBULATORY_CARE_PROVIDER_SITE_OTHER): Payer: BLUE CROSS/BLUE SHIELD

## 2015-10-15 DIAGNOSIS — Z23 Encounter for immunization: Secondary | ICD-10-CM | POA: Diagnosis not present

## 2015-10-25 ENCOUNTER — Other Ambulatory Visit: Payer: Self-pay

## 2015-10-25 ENCOUNTER — Telehealth: Payer: Self-pay | Admitting: Adult Health

## 2015-10-25 MED ORDER — BUPROPION HCL ER (XL) 300 MG PO TB24: ORAL_TABLET | ORAL | 0 refills | Status: DC

## 2015-10-25 NOTE — Telephone Encounter (Signed)
Ok to refill 

## 2015-10-25 NOTE — Telephone Encounter (Signed)
90 day supply has been sent to mail order and 14 day supply has been sent to local pharmacy.

## 2015-10-25 NOTE — Telephone Encounter (Signed)
Rx has been resent 

## 2015-10-25 NOTE — Telephone Encounter (Signed)
Pt following up on request refill  buPROPion (WELLBUTRIN XL) 300 MG 24 hr tablet  90 day  [facility]  Pt had cpe on 9/13 and thought this was done then  Now pt will be out and needs 14 days sent to local [facility]/ [address]

## 2015-10-25 NOTE — Telephone Encounter (Signed)
Pt needs 14 day supply send [facility]

## 2015-10-25 NOTE — Telephone Encounter (Signed)
Ok to refill and to send 14 days to local pharmacy?

## 2015-10-30 ENCOUNTER — Ambulatory Visit (INDEPENDENT_AMBULATORY_CARE_PROVIDER_SITE_OTHER): Payer: BLUE CROSS/BLUE SHIELD

## 2015-10-30 ENCOUNTER — Encounter: Payer: Self-pay | Admitting: Podiatry

## 2015-10-30 ENCOUNTER — Ambulatory Visit (INDEPENDENT_AMBULATORY_CARE_PROVIDER_SITE_OTHER): Payer: BLUE CROSS/BLUE SHIELD | Admitting: Podiatry

## 2015-10-30 ENCOUNTER — Ambulatory Visit: Payer: Self-pay

## 2015-10-30 VITALS — BP 155/93 | HR 65 | Resp 16 | Ht 60.0 in | Wt 123.0 lb

## 2015-10-30 DIAGNOSIS — M79672 Pain in left foot: Secondary | ICD-10-CM

## 2015-10-30 DIAGNOSIS — M79671 Pain in right foot: Secondary | ICD-10-CM

## 2015-10-30 DIAGNOSIS — M779 Enthesopathy, unspecified: Secondary | ICD-10-CM

## 2015-10-30 DIAGNOSIS — M216X9 Other acquired deformities of unspecified foot: Secondary | ICD-10-CM

## 2015-10-30 MED ORDER — TRIAMCINOLONE ACETONIDE 10 MG/ML IJ SUSP
10.0000 mg | Freq: Once | INTRAMUSCULAR | Status: AC
  Administered 2015-10-30: 10 mg

## 2015-10-30 NOTE — Progress Notes (Signed)
   Subjective:    Patient ID: Denise Bradley, female    DOB: Oct 02, 1950, 65 y.o.   MRN: [id number]  HPI Chief Complaint  Patient presents with  . Foot Pain    Right 5th toe and met x 6 mo.  States "I'm not able to put weight down on big toe so I shift my weight"       Review of Systems  All other systems reviewed and are negative.      Objective:   Physical Exam        Assessment & Plan:

## 2015-10-31 NOTE — Progress Notes (Signed)
Subjective:     Patient ID: Denise Bradley, female   DOB: 07/20/1950, 65 y.o.   MRN: [id number]  HPI patient states her right foot she still gets some pain on the inside and also some inflammation on the outside and the left foot is starting to become increasingly sore and making it hard for her to wear shoe gear comfortably   Review of Systems  All other systems reviewed and are negative.      Objective:   Physical Exam  Constitutional: She is oriented to person, place, and time.  Cardiovascular: Intact distal pulses.   Musculoskeletal: Normal range of motion.  Neurological: She is oriented to person, place, and time.  Skin: Skin is warm.  Nursing note and vitals reviewed.  neurovascular status intact muscle strength adequate range of motion within normal limits with patient noted to have pain in the fibular complex right first metatarsal with inflammation fluid buildup around the fifth metatarsal head right with keratotic lesion formation and on the left foot is noted to have structural bunion deformity hammertoe deformity of the fifth digit and Taylor's bunion deformity that's painful when pressed     Assessment:     Continues to have relatively rigid deformity first metatarsal right with tibial sesamoid complex that did well with removal with pain around the fibular sesamoid and inflammatory capsulitis fifth MPJ right with keratotic lesion formation. Left foot shows chronic structural bunion deformity with redness and pain chronic fifth metatarsal head pain with pain that has not responded to wider shoes and soaks and also keratotic lesion fifth digit left that get sore    Plan:     H&P conditions reviewed x-ray right foot reviewed. Did careful capsular injection fifth MPJ right debrided the lesion take pressure off and discussed structural bunion correction left and hammertoe repair along with met osteotomy fifth left. Patient wants surgery and we'll do in November and ultimately  may require fibular sesamoidectomy with IPJ fusion right  X-ray report right indicates screws are in place with fibular sesamoid may be exposed and on the left foot showed structural bunion with elevation of the angle of approximately 15 with elevation of the intermetatarsal angle between the fourth and fifth metatarsals and hammertoe deformity fifth left     

## 2015-11-20 ENCOUNTER — Ambulatory Visit (INDEPENDENT_AMBULATORY_CARE_PROVIDER_SITE_OTHER): Payer: BLUE CROSS/BLUE SHIELD | Admitting: Podiatry

## 2015-11-20 DIAGNOSIS — M779 Enthesopathy, unspecified: Secondary | ICD-10-CM

## 2015-11-20 DIAGNOSIS — M216X9 Other acquired deformities of unspecified foot: Secondary | ICD-10-CM

## 2015-11-20 NOTE — Patient Instructions (Addendum)

## 2015-11-21 NOTE — Progress Notes (Signed)
Subjective:     Patient ID: Denise Bradley, female   DOB: [birth year], 65 y.o.   MRN: [id number]  HPI patient presents stating she wants orthotics but does not want surgery at the current time   Review of Systems     Objective:   Physical Exam Neurovascular status intact with patient having pain plantar right and also structural deformity left    Assessment:     Chronic foot deformity right with structural deformity left    Plan:     Dispensed orthotics with instructions on breaking them in and patient will be seen back as needed when she decides right foot correction is appropriate for her

## 2015-12-11 ENCOUNTER — Ambulatory Visit (INDEPENDENT_AMBULATORY_CARE_PROVIDER_SITE_OTHER): Payer: BLUE CROSS/BLUE SHIELD

## 2015-12-11 DIAGNOSIS — M779 Enthesopathy, unspecified: Secondary | ICD-10-CM

## 2015-12-11 NOTE — Patient Instructions (Signed)

## 2015-12-11 NOTE — Progress Notes (Signed)
Patient presents for orthotic pick up.  Verbal and written break in and wear instructions given.  Patient will follow up in 4 weeks if symptoms worsen or fail to improve. 

## 2016-01-28 ENCOUNTER — Encounter: Payer: Self-pay | Admitting: Internal Medicine

## 2016-01-28 ENCOUNTER — Ambulatory Visit (INDEPENDENT_AMBULATORY_CARE_PROVIDER_SITE_OTHER): Payer: BLUE CROSS/BLUE SHIELD | Admitting: Internal Medicine

## 2016-01-28 ENCOUNTER — Telehealth: Payer: Self-pay | Admitting: Adult Health

## 2016-01-28 VITALS — BP 146/80 | HR 98 | Temp 98.3°F | Ht 60.0 in | Wt 120.8 lb

## 2016-01-28 DIAGNOSIS — A09 Infectious gastroenteritis and colitis, unspecified: Secondary | ICD-10-CM

## 2016-01-28 MED ORDER — DIPHENOXYLATE-ATROPINE 2.5-0.025 MG PO TABS: 1.0000 | ORAL_TABLET | Freq: Four times a day (QID) | ORAL | 0 refills | Status: DC | PRN

## 2016-01-28 NOTE — Progress Notes (Signed)
Pre visit review using our clinic review tool, if applicable. No additional management support is needed unless otherwise documented below in the visit note. 

## 2016-01-28 NOTE — Telephone Encounter (Signed)
Patient Name: Denise Bradley DOB: December 08, 1950 Initial Comment Caller states she has had diarrhea for 6 days. She had two surgeries on in Dec. After being on the antibiotics for a week, the diarrhea started. Nurse Assessment Nurse: Marcelline Deist, RN, Lynda Date/Time (Eastern Time): 01/28/2016 8:38:36 AM Confirm and document reason for call. If symptomatic, describe symptoms. ---Caller states she has had diarrhea for 6 days. She had two surgeries (root canal) on in Dec. After being on the antibiotics for a week, the diarrhea started. Also has concerns about incision. Has had fatigue. No fever. Does the patient have any new or worsening symptoms? ---Yes Will a triage be completed? ---Yes Related visit to physician within the last 2 weeks? ---No Does the PT have any chronic conditions? (i.e. diabetes, asthma, etc.) ---Yes List chronic conditions. ---on Wellbutrin Is this a behavioral health or substance abuse call? ---No Guidelines Guideline Title Affirmed Question Affirmed Notes Diarrhea [1] SEVERE diarrhea (e.g., 7 or more times / day more than normal) AND [2] age > 60 years Final Disposition User See Physician within 4 Hours (or PCP triage) Marcelline Deist, RN, Lynda Referrals REFERRED TO PCP OFFICE Disagree/Comply: Comply

## 2016-01-28 NOTE — Progress Notes (Signed)
Subjective:    Patient ID: Denise Bradley, female    DOB: 11/06/1950, 66 y.o.   MRN: [id number]  HPI 66 year old patient who presents with a chief complaint of diarrhea.  This has been present for approximately 6 days She was treated with clindamycin approximately 2 weeks ago for a dental infection.  Denies any fever, abdominal pain, nausea, vomiting or other constitutional complaints except for some mild fatigue. She states the stool is loose with particulate matter and mucus.  No nocturnal diarrhea last night but has had 4 loose stools since awakening earlier this a.m.  Past Medical History:  Diagnosis Date  . Depression   . Hyperlipidemia      Social History   Social History  . Marital status: Widowed    Spouse name: N/A  . Number of children: N/A  . Years of education: N/A   Occupational History  . Not on file.   Social History Main Topics  . Smoking status: Never Smoker  . Smokeless tobacco: Never Used  . Alcohol use 1.5 oz/week    3 drink(s) per week  . Drug use: No  . Sexual activity: Not on file   Other Topics Concern  . Not on file   Social History Narrative   Retired - Worked with [employer] and last May she was laid off. She worked there for 22 years   Going back to school for [employer].    No pets   Three children ( [place], [place]. [place] ( Son and two daughters- all married. & grandchildren)   States " Does not do anything fun".          Diet: Eat healthy, does not eat fast food.    Exercise: Does not exercise, does not feel like she has time.           Past Surgical History:  Procedure Laterality Date  . COLONOSCOPY    . FOOT SURGERY  01/15/2012   right  . FOOT SURGERY  July 2014   right  . KNEE ARTHROSCOPY    . LIPOSUCTION  5/05    Family History  Problem Relation Age of Onset  . Diabetes Father   . Hypertension Father   . Heart disease Father     CABG  . Cancer Mother 37    cancer unknown primary  . COPD Mother   .  Colon cancer Neg Hx     Allergies  Allergen Reactions  . Acetaminophen Anaphylaxis    REACTION: swells throat shut/can't breathe    Current Outpatient Prescriptions on File Prior to Visit  Medication Sig Dispense Refill  . ALPRAZolam (XANAX) 0.5 MG tablet Take 1 tablet (0.5 mg total) by mouth at bedtime as needed for anxiety. 30 tablet 0  . buPROPion (WELLBUTRIN XL) 300 MG 24 hr tablet TAKE 1 TABLET DAILY 14 tablet 0  . Cholecalciferol (VITAMIN D PO) Take 1 tablet by mouth daily.    . Multiple Vitamins-Minerals (WOMENS BONE HEALTH PO) Take 1 tablet by mouth daily.     No current facility-administered medications on file prior to visit.     BP (!) 146/80 (BP Location: Right Arm, Patient Position: Sitting, Cuff Size: Normal)   Pulse 98   Temp 98.3 F (36.8 C) (Oral)   Ht 5' (1.524 m)   Wt 120 lb 12 oz (54.8 kg)   SpO2 98%   BMI 23.58 kg/m      Review of Systems  Constitutional: Positive for fatigue.  HENT: Negative  for congestion, dental problem, hearing loss, rhinorrhea, sinus pressure, sore throat and tinnitus.   Eyes: Negative for pain, discharge and visual disturbance.  Respiratory: Negative for cough and shortness of breath.   Cardiovascular: Negative for chest pain, palpitations and leg swelling.  Gastrointestinal: Positive for diarrhea. Negative for abdominal distention, abdominal pain, blood in stool, constipation, nausea and vomiting.  Genitourinary: Negative for difficulty urinating, dysuria, flank pain, frequency, hematuria, pelvic pain, urgency, vaginal bleeding, vaginal discharge and vaginal pain.  Musculoskeletal: Negative for arthralgias, gait problem and joint swelling.  Skin: Negative for rash.  Neurological: Negative for dizziness, syncope, speech difficulty, weakness, numbness and headaches.  Hematological: Negative for adenopathy.  Psychiatric/Behavioral: Negative for agitation, behavioral problems and dysphoric mood. The patient is not nervous/anxious.          Objective:   Physical Exam  Constitutional: She is oriented to person, place, and time. She appears well-developed and well-nourished. No distress.  Afebrile Appears clinically well and in no distress No tachycardia  HENT:  Head: Normocephalic.  Right Ear: External ear normal.  Left Ear: External ear normal.  Mouth/Throat: Oropharynx is clear and moist.  Eyes: Conjunctivae and EOM are normal. Pupils are equal, round, and reactive to light.  Neck: Normal range of motion. Neck supple. No thyromegaly present.  Cardiovascular: Normal rate, regular rhythm, normal heart sounds and intact distal pulses.   Pulmonary/Chest: Effort normal and breath sounds normal.  Abdominal: Soft. Bowel sounds are normal. She exhibits no distension and no mass. There is no tenderness. There is no rebound and no guarding.  Musculoskeletal: Normal range of motion.  Lymphadenopathy:    She has no cervical adenopathy.  Neurological: She is alert and oriented to person, place, and time.  Skin: Skin is warm and dry. No rash noted.  Psychiatric: She has a normal mood and affect. Her behavior is normal.          Assessment & Plan:   Diarrhea in the setting of recent antibiotic use.  Will check stool for C. Difficile toxin.  If positive will treat with metronidazole.  The patient will consider an antidiarrheal and prescription dispensed Low residue diet.  Information dispensed  Nyoka Cowden

## 2016-01-28 NOTE — Patient Instructions (Addendum)
Food Choices to Help Relieve Diarrhea, Adult When you have diarrhea, the foods you eat and your eating habits are very important. Choosing the right foods and drinks can help relieve diarrhea. Also, because diarrhea can last up to 7 days, you need to replace lost fluids and electrolytes (such as sodium, potassium, and chloride) in order to help prevent dehydration. What general guidelines do I need to follow?  Slowly drink 1 cup (8 oz) of fluid for each episode of diarrhea. If you are getting enough fluid, your urine will be clear or pale yellow.  Eat starchy foods. Some good choices include white rice, white toast, pasta, low-fiber cereal, baked potatoes (without the skin), saltine crackers, and bagels.  Avoid large servings of any cooked vegetables.  Limit fruit to two servings per day. A serving is  cup or 1 small piece.  Choose foods with less than 2 g of fiber per serving.  Limit fats to less than 8 tsp (38 g) per day.  Avoid fried foods.  Eat foods that have probiotics in them. Probiotics can be found in certain dairy products.  Avoid foods and beverages that may increase the speed at which food moves through the stomach and intestines (gastrointestinal tract). Things to avoid include:  High-fiber foods, such as dried fruit, raw fruits and vegetables, nuts, seeds, and whole grain foods.  Spicy foods and high-fat foods.  Foods and beverages sweetened with high-fructose corn syrup, honey, or sugar alcohols such as xylitol, sorbitol, and mannitol. What foods are recommended? Grains  White rice. White, French, or pita breads (fresh or toasted), including plain rolls, buns, or bagels. White pasta. Saltine, soda, or graham crackers. Pretzels. Low-fiber cereal. Cooked cereals made with water (such as cornmeal, farina, or cream cereals). Plain muffins. Matzo. Melba toast. Zwieback. Vegetables  Potatoes (without the skin). Strained tomato and vegetable juices. Most well-cooked and canned  vegetables without seeds. Tender lettuce. Fruits  Cooked or canned applesauce, apricots, cherries, fruit cocktail, grapefruit, peaches, pears, or plums. Fresh bananas, apples without skin, cherries, grapes, cantaloupe, grapefruit, peaches, oranges, or plums. Meat and Other Protein Products  Baked or boiled chicken. Eggs. Tofu. Fish. Seafood. Smooth peanut butter. Ground or well-cooked tender beef, ham, veal, lamb, pork, or poultry. Dairy  Plain yogurt, kefir, and unsweetened liquid yogurt. Lactose-free milk, buttermilk, or soy milk. Plain hard cheese. Beverages  Sport drinks. Clear broths. Diluted fruit juices (except prune). Regular, caffeine-free sodas such as ginger ale. Water. Decaffeinated teas. Oral rehydration solutions. Sugar-free beverages not sweetened with sugar alcohols. Other  Bouillon, broth, or soups made from recommended foods. The items listed above may not be a complete list of recommended foods or beverages. Contact your dietitian for more options.  What foods are not recommended? Grains  Whole grain, whole wheat, bran, or rye breads, rolls, pastas, crackers, and cereals. Wild or brown rice. Cereals that contain more than 2 g of fiber per serving. Corn tortillas or taco shells. Cooked or dry oatmeal. Granola. Popcorn. Vegetables  Raw vegetables. Cabbage, broccoli, Brussels sprouts, artichokes, baked beans, beet greens, corn, kale, legumes, peas, sweet potatoes, and yams. Potato skins. Cooked spinach and cabbage. Fruits  Dried fruit, including raisins and dates. Raw fruits. Stewed or dried prunes. Fresh apples with skin, apricots, mangoes, pears, raspberries, and strawberries. Meat and Other Protein Products  Chunky peanut butter. Nuts and seeds. Beans and lentils. Bacon. Dairy  High-fat cheeses. Milk, chocolate milk, and beverages made with milk, such as milk shakes. Cream. Ice cream. Sweets and Desserts    Sweet rolls, doughnuts, and sweet breads. Pancakes and waffles. Fats  and Oils  Butter. Cream sauces. Margarine. Salad oils. Plain salad dressings. Olives. Avocados. Beverages  Caffeinated beverages (such as coffee, tea, soda, or energy drinks). Alcoholic beverages. Fruit juices with pulp. Prune juice. Soft drinks sweetened with high-fructose corn syrup or sugar alcohols. Other  Coconut. Hot sauce. Chili powder. Mayonnaise. Gravy. Cream-based or milk-based soups. The items listed above may not be a complete list of foods and beverages to avoid. Contact your dietitian for more information.  What should I do if I become dehydrated? Diarrhea can sometimes lead to dehydration. Signs of dehydration include dark urine and dry mouth and skin. If you think you are dehydrated, you should rehydrate with an oral rehydration solution. These solutions can be purchased at pharmacies, retail stores, or online. Drink -1 cup (120-240 mL) of oral rehydration solution each time you have an episode of diarrhea. If drinking this amount makes your diarrhea worse, try drinking smaller amounts more often. For example, drink 1-3 tsp (5-15 mL) every 5-10 minutes. A general rule for staying hydrated is to drink 1-2 L of fluid per day. Talk to your health care provider about the specific amount you should be drinking each day. Drink enough fluids to keep your urine clear or pale yellow. This information is not intended to replace advice given to you by your health care provider. Make sure you discuss any questions you have with your health care provider. Document Released: 04/04/2003 Document Revised: 06/20/2015 Document Reviewed: 12/05/2012 Elsevier Interactive Patient Education  2017 [place].  Diarrhea, Adult Introduction Diarrhea is when you have loose and water poop (stool) often. Diarrhea can make you feel weak and cause you to get dehydrated. Dehydration can make you tired and thirsty, make you have a dry mouth, and make it so you pee (urinate) less often. Diarrhea often lasts 2-3  days. However, it can last longer if it is a sign of something more serious. It is important to treat your diarrhea as told by your doctor. Follow these instructions at home: Eating and drinking Follow these recommendations as told by your doctor:  Take an oral rehydration solution (ORS). This is a drink that is sold at pharmacies and stores.  Drink clear fluids, such as:  Water.  Ice chips.  Diluted fruit juice.  Low-calorie sports drinks.  Eat bland, easy-to-digest foods in small amounts as you are able. These foods include:  Bananas.  Applesauce.  Rice.  Low-fat (lean) meats.  Toast.  Crackers.  Avoid drinking fluids that have a lot of sugar or caffeine in them.  Avoid alcohol.  Avoid spicy or fatty foods. General instructions  Drink enough fluid to keep your pee (urine) clear or pale yellow.  Wash your hands often. If you cannot use soap and water, use hand sanitizer.  Make sure that all people in your home wash their hands well and often.  Take over-the-counter and prescription medicines only as told by your doctor.  Rest at home while you get better.  Watch your condition for any changes.  Take a warm bath to help with any burning or pain from having diarrhea.  Keep all follow-up visits as told by your doctor. This is important. Contact a doctor if:  You have a fever.  Your diarrhea gets worse.  You have new symptoms.  You cannot keep fluids down.  You feel light-headed or dizzy.  You have a headache.  You have muscle cramps. Get help right  away if:  You have chest pain.  You feel very weak or you pass out (faint).  You have bloody or black poop or poop that look like tar.  You have very bad pain, cramping, or bloating in your belly (abdomen).  You have trouble breathing or you are breathing very quickly.  Your heart is beating very quickly.  Your skin feels cold and clammy.  You feel confused.  You have signs of dehydration,  such as:  Dark pee, hardly any pee, or no pee.  Cracked lips.  Dry mouth.  Sunken eyes.  Sleepiness.  Weakness. This information is not intended to replace advice given to you by your health care provider. Make sure you discuss any questions you have with your health care provider. Document Released: 07/01/2007 Document Revised: 08/02/2015 Document Reviewed: 09/18/2014  2017 Elsevier

## 2016-01-28 NOTE — Telephone Encounter (Signed)
Pt has appt with Dr Raliegh Ip @ 1pm today.

## 2016-01-30 ENCOUNTER — Other Ambulatory Visit: Payer: Self-pay | Admitting: Family Medicine

## 2016-01-30 ENCOUNTER — Telehealth: Payer: Self-pay | Admitting: *Deleted

## 2016-01-30 DIAGNOSIS — A0472 Enterocolitis due to Clostridium difficile, not specified as recurrent: Secondary | ICD-10-CM

## 2016-01-30 LAB — CLOSTRIDIUM DIFFICILE BY PCR: CDIFFPCR: DETECTED — AB

## 2016-01-30 MED ORDER — METRONIDAZOLE 500 MG PO TABS: 500.0000 mg | ORAL_TABLET | Freq: Three times a day (TID) | ORAL | 0 refills | Status: DC

## 2016-01-30 NOTE — Progress Notes (Signed)
Positive for C-diff; will provide metronidazole for 10 days; advise follow up with her PCP next week or sooner if symptoms do not improve with treatment, worsens, or she is unable to remain hydrated.

## 2016-01-30 NOTE — Telephone Encounter (Signed)
Denise Bradley, he is the pt with positive C-Diff. Please review and give orders. Thanks

## 2016-01-30 NOTE — Telephone Encounter (Signed)
CRITICAL VALUE STICKER  CRITICAL VALUE: C-Diff positive by PCR  RECEIVER (on-site recipient of call): Anselmo Pickler, LPN  DATE & TIME NOTIFIED: 01/30/2016 at 1504  MESSENGER (representative from lab): Gwyndolyn Saxon  MD NOTIFIED: Delano Metz  TIME OF NOTIFICATION: [place] notified verbally and message sent to her.  RESPONSE: Gregary Signs said she will treat patient

## 2016-01-30 NOTE — Telephone Encounter (Signed)
Spoke to pt, told her Almyra Free NP reviewed her lab result and your stool specimen came back positive for C-Diff. Pt verbalized understanding. Told pt Rx for Metronidazole 500 mg one tablet 3 times a day was sent to pharmacy and need to follow up with Dr.K next week or sooner if diarrhea continues. Pt verbalized understanding.

## 2016-01-30 NOTE — Telephone Encounter (Signed)
Pt would like stool card result. Pt saw dr Raliegh Ip

## 2016-01-30 NOTE — Telephone Encounter (Signed)
See orders and documentation for metronidazole treatment

## 2016-02-03 ENCOUNTER — Telehealth: Payer: Self-pay | Admitting: Internal Medicine

## 2016-02-03 NOTE — Telephone Encounter (Signed)
All questions answered She is advised to start Florastor BID for 6 weeks

## 2016-02-10 ENCOUNTER — Encounter: Payer: Self-pay | Admitting: Internal Medicine

## 2016-02-10 ENCOUNTER — Ambulatory Visit (INDEPENDENT_AMBULATORY_CARE_PROVIDER_SITE_OTHER): Payer: BLUE CROSS/BLUE SHIELD | Admitting: Internal Medicine

## 2016-02-10 VITALS — BP 118/70 | HR 70 | Temp 97.5°F | Ht 61.0 in | Wt 119.4 lb

## 2016-02-10 DIAGNOSIS — A0472 Enterocolitis due to Clostridium difficile, not specified as recurrent: Secondary | ICD-10-CM | POA: Diagnosis not present

## 2016-02-10 NOTE — Patient Instructions (Signed)
Call or return to clinic prn if these symptoms worsen or fail to improve as anticipated.

## 2016-02-10 NOTE — Progress Notes (Signed)
Subjective:    Patient ID: Denise Bradley, female    DOB: August 15, 1950, 66 y.o.   MRN: [id number]  HPI  66 year old patient who developed diarrhea following treatment with clindamycin for a dental infection.  She has completed 10 days of metronidazole after stool sample tested positive for C. Difficile toxin.  She feels well today.  She has some questions about probiotics.  She has discussed this also with GI  Past Medical History:  Diagnosis Date  . Depression   . Hyperlipidemia      Social History   Social History  . Marital status: Widowed    Spouse name: N/A  . Number of children: N/A  . Years of education: N/A   Occupational History  . Not on file.   Social History Main Topics  . Smoking status: Never Smoker  . Smokeless tobacco: Never Used  . Alcohol use 1.5 oz/week    3 drink(s) per week  . Drug use: No  . Sexual activity: Not on file   Other Topics Concern  . Not on file   Social History Narrative   Retired - Worked with [employer] and last May she was laid off. She worked there for 22 years   Going back to school for [employer].    No pets   Three children ( [place], [place]. [place] ( Son and two daughters- all married. & grandchildren)   States " Does not do anything fun".          Diet: Eat healthy, does not eat fast food.    Exercise: Does not exercise, does not feel like she has time.           Past Surgical History:  Procedure Laterality Date  . COLONOSCOPY    . FOOT SURGERY  01/15/2012   right  . FOOT SURGERY  July 2014   right  . KNEE ARTHROSCOPY    . LIPOSUCTION  5/05    Family History  Problem Relation Age of Onset  . Diabetes Father   . Hypertension Father   . Heart disease Father     CABG  . Cancer Mother 55    cancer unknown primary  . COPD Mother   . Colon cancer Neg Hx     Allergies  Allergen Reactions  . Acetaminophen Anaphylaxis    REACTION: swells throat shut/can't breathe    Current Outpatient  Prescriptions on File Prior to Visit  Medication Sig Dispense Refill  . ALPRAZolam (XANAX) 0.5 MG tablet Take 1 tablet (0.5 mg total) by mouth at bedtime as needed for anxiety. 30 tablet 0  . buPROPion (WELLBUTRIN XL) 300 MG 24 hr tablet TAKE 1 TABLET DAILY 14 tablet 0  . Cholecalciferol (VITAMIN D PO) Take 1 tablet by mouth daily.    . diphenoxylate-atropine (LOMOTIL) 2.5-0.025 MG tablet Take 1 tablet by mouth 4 (four) times daily as needed for diarrhea or loose stools. 30 tablet 0  . metroNIDAZOLE (FLAGYL) 500 MG tablet Take 1 tablet (500 mg total) by mouth 3 (three) times daily. 30 tablet 0  . Multiple Vitamins-Minerals (WOMENS BONE HEALTH PO) Take 1 tablet by mouth daily.     No current facility-administered medications on file prior to visit.     BP 118/70 (BP Location: Right Arm, Patient Position: Sitting, Cuff Size: Normal)   Pulse 70   Temp 97.5 F (36.4 C) (Oral)   Ht 5\' 1"  (1.549 m)   Wt 119 lb 6.4 oz (54.2 kg)   SpO2  98%   BMI 22.56 kg/m     Review of Systems  Constitutional: Negative.   HENT: Negative for congestion, dental problem, hearing loss, rhinorrhea, sinus pressure, sore throat and tinnitus.   Eyes: Negative for pain, discharge and visual disturbance.  Respiratory: Negative for cough and shortness of breath.   Cardiovascular: Negative for chest pain, palpitations and leg swelling.  Gastrointestinal: Positive for diarrhea. Negative for abdominal distention, abdominal pain, blood in stool, constipation, nausea and vomiting.  Genitourinary: Negative for difficulty urinating, dysuria, flank pain, frequency, hematuria, pelvic pain, urgency, vaginal bleeding, vaginal discharge and vaginal pain.  Musculoskeletal: Negative for arthralgias, gait problem and joint swelling.  Skin: Negative for rash.  Neurological: Negative for dizziness, syncope, speech difficulty, weakness, numbness and headaches.  Hematological: Negative for adenopathy.  Psychiatric/Behavioral:  Negative for agitation, behavioral problems and dysphoric mood. The patient is not nervous/anxious.        Objective:   Physical Exam  Constitutional: She appears well-developed and well-nourished. No distress.  Abdominal: Soft. Bowel sounds are normal. She exhibits no distension. There is no tenderness. There is no rebound and no guarding.          Assessment & Plan:   Status post treatment for C. Difficile infection.  The patient has completed antibiotic therapy.  Probiotic therapy for 2-6 weeks.  Encouraged.  Return here when necessary  Nyoka Cowden

## 2016-02-10 NOTE — Progress Notes (Signed)
Pre visit review using our clinic review tool, if applicable. No additional management support is needed unless otherwise documented below in the visit note. 

## 2016-02-17 ENCOUNTER — Telehealth: Payer: Self-pay | Admitting: Adult Health

## 2016-02-17 NOTE — Telephone Encounter (Signed)
Pt request refill  buPROPion (WELLBUTRIN XL) 300 MG 24 hr tablet9 90 tabs  W/ refills  [facility]  Pt has called them last week and we do not have any record of that. Pt states they are saying they contacted [place]. [phone]

## 2016-02-18 ENCOUNTER — Other Ambulatory Visit: Payer: Self-pay

## 2016-02-18 MED ORDER — BUPROPION HCL ER (XL) 300 MG PO TB24: ORAL_TABLET | ORAL | 1 refills | Status: DC

## 2016-02-18 NOTE — Telephone Encounter (Signed)
Ok to refill for 6 months 

## 2016-02-18 NOTE — Telephone Encounter (Signed)
Rx has been refilled.  

## 2016-02-18 NOTE — Telephone Encounter (Signed)
Ok to refill 

## 2016-07-22 ENCOUNTER — Ambulatory Visit (INDEPENDENT_AMBULATORY_CARE_PROVIDER_SITE_OTHER): Payer: BLUE CROSS/BLUE SHIELD

## 2016-07-22 ENCOUNTER — Encounter: Payer: Self-pay | Admitting: Podiatry

## 2016-07-22 ENCOUNTER — Ambulatory Visit (INDEPENDENT_AMBULATORY_CARE_PROVIDER_SITE_OTHER): Payer: BLUE CROSS/BLUE SHIELD | Admitting: Podiatry

## 2016-07-22 DIAGNOSIS — M216X9 Other acquired deformities of unspecified foot: Secondary | ICD-10-CM

## 2016-07-22 DIAGNOSIS — M21619 Bunion of unspecified foot: Secondary | ICD-10-CM

## 2016-07-22 DIAGNOSIS — M21621 Bunionette of right foot: Secondary | ICD-10-CM | POA: Diagnosis not present

## 2016-07-22 DIAGNOSIS — M7750 Other enthesopathy of unspecified foot: Secondary | ICD-10-CM

## 2016-07-22 DIAGNOSIS — M779 Enthesopathy, unspecified: Secondary | ICD-10-CM | POA: Diagnosis not present

## 2016-07-22 DIAGNOSIS — M778 Other enthesopathies, not elsewhere classified: Secondary | ICD-10-CM

## 2016-07-22 MED ORDER — TRIAMCINOLONE ACETONIDE 10 MG/ML IJ SUSP
10.0000 mg | Freq: Once | INTRAMUSCULAR | Status: AC
  Administered 2016-07-22: 10 mg

## 2016-07-22 NOTE — Progress Notes (Signed)
Subjective:    Patient ID: Denise Bradley, female   DOB: 66 y.o.   MRN: [id number]   HPI patient states my right foot is still sore underneath and I am still having problems with the left foot with the bunion and the bone on the outside    ROS      Objective:  Physical Exam neurovascular status intact with patient found to have a rigidly plantar flexed first metatarsal right with history of Lapidus procedure with discomfort mostly centered on the fibular sesamoid with pain also in the lateral side with keratotic lesion fluid buildup fifth MPJ bilateral and structural bunion deformity left     Assessment:   Plantarflexion of the first MPJ right with sesamoidal pain and structural bunion deformity left along with tailor's bunion deformity bilateral with inflammatory keratotic lesion sub-fifth bilateral with fluid buildup      Plan:    H&P all conditions reviewed and today I recommended capsular injection fifth MPJ bilateral with debridement discuss surgical correction for the left foot and the possibility for surgery of the right even though this is a very difficult problem and there is No Long-Term Guarantees As to What We Can Do to Help Her and There Is a Possibility That over Time She Will Develop or Symptoms. She Understands All of This and Today the Injections Were Accomplished with Debridement and She Will Reappoint in 2 Weeks to Discuss in Greater Detail  X-rays indicate that the screws are in place in the first metatarsocuneiform joint right with plantar flexion of the metatarsal and no dorsal excursion with fibular sesamoidal irritation noted and enlargement with a screw fifth metatarsal right with that bone having healed well but still having plantar issues. Noted to have structural bunion deformity left with tailor's bunion deformity

## 2016-08-06 ENCOUNTER — Ambulatory Visit (INDEPENDENT_AMBULATORY_CARE_PROVIDER_SITE_OTHER): Payer: BLUE CROSS/BLUE SHIELD | Admitting: Podiatry

## 2016-08-06 ENCOUNTER — Encounter: Payer: Self-pay | Admitting: Podiatry

## 2016-08-06 DIAGNOSIS — M21622 Bunionette of left foot: Secondary | ICD-10-CM | POA: Diagnosis not present

## 2016-08-06 DIAGNOSIS — M21619 Bunion of unspecified foot: Secondary | ICD-10-CM | POA: Diagnosis not present

## 2016-08-06 DIAGNOSIS — M21621 Bunionette of right foot: Secondary | ICD-10-CM | POA: Diagnosis not present

## 2016-08-06 DIAGNOSIS — M216X9 Other acquired deformities of unspecified foot: Secondary | ICD-10-CM

## 2016-08-06 DIAGNOSIS — Z472 Encounter for removal of internal fixation device: Secondary | ICD-10-CM

## 2016-08-06 NOTE — Progress Notes (Signed)
Subjective:    Patient ID: Denise Bradley, female   DOB: 66 y.o.   MRN: [id number]   HPI patient presents stating she had temporary relief with the injection and it's back to work was before and she feels like the screws may be bothering her on top of the foot and the outside of the foot remains painful on both along with bunion deformity left    ROS      Objective:  Physical Exam neurovascular status intact with prominence and pain around the fibular sesamoid right with previous tibial sesamoidectomy and metatarsocuneiform fusion with mild plantar flexion first metatarsal. Also noted of chronic keratotic lesion fifth metatarsal right and left with previous osteotomy fifth metatarsal right right with continued prominence of bone and is noted to have mild prominence dorsum of the first metatarsocuneiform previous screw fixation was created     Assessment:  Appears to be chronic irritation around the fibular sesamoid right secondary to positioning component enlargement of the bone and pain with prominent fifth metatarsal right Taylor's bunion deformity left bunion deformity left and screw abnormality of the first metatarsocuneiform joint right       Plan:    H&P and all conditions reviewed. I did discuss consideration for fibular sesamoidectomy fifth metatarsal head resection and screw removal if possible dorsum foot. I spent a great deal time going over with care that there is no long-term guarantees this will get her better and that there is a possibility that she will develop pathology of the big toe joint which may create further surgery. She understands all risk wants surgery and reads over consent form going over alternative treatments complications and after extensive review signs consent form after questions were gone over. She will have fibular sesamoidectomy fifth metatarsal head resection and attempted removing screws dorsally with possibility we may not be able to get them both out  if they are too entrenched. She understands all this signs consent form and is scheduled for surgery and will call when she looks at her calendar and is encouraged to call with any other questions. Understands recovery is about 6 months and also we discussed left foot which she wants to get done but we'll be done after the right foot

## 2016-08-10 ENCOUNTER — Telehealth: Payer: Self-pay | Admitting: *Deleted

## 2016-08-10 NOTE — Telephone Encounter (Signed)
"  I'm a patient of Dr. Paulla Dolly.  I am not ready to schedule surgery yet but I wanted to know what day he does surgery on."  He does surgery on Tuesdays.  "How does his schedule look for the Fall?"  His schedule is very open for the Fall.  "Okay, that's all I needed to know."

## 2016-09-09 ENCOUNTER — Other Ambulatory Visit: Payer: Self-pay | Admitting: Adult Health

## 2016-09-09 ENCOUNTER — Telehealth: Payer: Self-pay | Admitting: Adult Health

## 2016-09-09 DIAGNOSIS — Z1231 Encounter for screening mammogram for malignant neoplasm of breast: Secondary | ICD-10-CM

## 2016-09-09 MED ORDER — BUPROPION HCL ER (XL) 300 MG PO TB24: ORAL_TABLET | ORAL | 0 refills | Status: DC

## 2016-09-09 NOTE — Telephone Encounter (Signed)
Pt request refill  buPROPion (WELLBUTRIN XL) 300 MG 24 hr tablet  Pt has a week left, but not due for cpe until 9/13. cpe scheduled, can you send 30 day to  [facility], [address] AT [address]  *please remove   [id number] [place] [place] [facility] [address]  Pt upset this pharmacy still on her list.  Keep [facility]

## 2016-09-09 NOTE — Telephone Encounter (Signed)
Sent to the pharmacy by e-scribe for 90 days.  Pt has upcoming cpx on 10/09/16

## 2016-09-15 ENCOUNTER — Telehealth: Payer: Self-pay | Admitting: *Deleted

## 2016-09-15 ENCOUNTER — Other Ambulatory Visit: Payer: Self-pay | Admitting: Adult Health

## 2016-09-15 NOTE — Telephone Encounter (Signed)
"  I'm a patient of Dr. Paulla Dolly and I'm calling to schedule my foot surgery."  Have you signed consent forms?  "Yes, I have."  Do you have a date in mind that you would like to schedule your surgery?  "Yes, I'd like to do it on September 18.  Is that time available?"  Yes, that date is available.  I'll get it scheduled.  "Is there anything else I need to do?"  You will need to register with the surgical center, instructions are in the brochure from [place] from the surgical center will call with the arrival time either the Friday or the Monday before surgery date.  You will need to clean your foot the night before surgery date.

## 2016-09-16 NOTE — Telephone Encounter (Signed)
Sent to the [facility] for 90 days.  Pt has upcoming yearly with Cory on 10/09/16.

## 2016-10-05 ENCOUNTER — Ambulatory Visit
Admission: RE | Admit: 2016-10-05 | Discharge: 2016-10-05 | Disposition: A | Discharge status: Home / Self Care | Payer: BLUE CROSS/BLUE SHIELD | Source: Ambulatory Visit | Attending: Adult Health | Admitting: Adult Health

## 2016-10-05 DIAGNOSIS — Z1231 Encounter for screening mammogram for malignant neoplasm of breast: Secondary | ICD-10-CM

## 2016-10-09 ENCOUNTER — Encounter: Payer: Self-pay | Admitting: Adult Health

## 2016-10-09 ENCOUNTER — Ambulatory Visit (INDEPENDENT_AMBULATORY_CARE_PROVIDER_SITE_OTHER): Payer: BLUE CROSS/BLUE SHIELD | Admitting: Adult Health

## 2016-10-09 VITALS — BP 116/80 | HR 67 | Temp 98.2°F | Ht 60.0 in | Wt 120.0 lb

## 2016-10-09 DIAGNOSIS — Z0000 Encounter for general adult medical examination without abnormal findings: Secondary | ICD-10-CM | POA: Diagnosis not present

## 2016-10-09 DIAGNOSIS — E785 Hyperlipidemia, unspecified: Secondary | ICD-10-CM | POA: Diagnosis not present

## 2016-10-09 DIAGNOSIS — F329 Major depressive disorder, single episode, unspecified: Secondary | ICD-10-CM | POA: Diagnosis not present

## 2016-10-09 DIAGNOSIS — F32A Depression, unspecified: Secondary | ICD-10-CM

## 2016-10-09 DIAGNOSIS — Z23 Encounter for immunization: Secondary | ICD-10-CM | POA: Diagnosis not present

## 2016-10-09 LAB — CBC WITH DIFFERENTIAL/PLATELET
BASOS PCT: 0.7 %
Basophils Absolute: 39 cells/uL (ref 0–200)
Eosinophils Absolute: 72 cells/uL (ref 15–500)
Eosinophils Relative: 1.3 %
HCT: 40.7 % (ref 35.0–45.0)
Hemoglobin: 13.7 g/dL (ref 11.7–15.5)
Lymphs Abs: 2052 cells/uL (ref 850–3900)
MCH: 29.5 pg (ref 27.0–33.0)
MCHC: 33.7 g/dL (ref 32.0–36.0)
MCV: 87.7 fL (ref 80.0–100.0)
MONOS PCT: 9.2 %
MPV: 11 fL (ref 7.5–12.5)
Neutro Abs: 2833 cells/uL (ref 1500–7800)
Neutrophils Relative %: 51.5 %
PLATELETS: 214 10*3/uL (ref 140–400)
RBC: 4.64 10*6/uL (ref 3.80–5.10)
RDW: 12.6 % (ref 11.0–15.0)
TOTAL LYMPHOCYTE: 37.3 %
WBC mixed population: 506 cells/uL (ref 200–950)
WBC: 5.5 10*3/uL (ref 3.8–10.8)

## 2016-10-09 LAB — LIPID PANEL
CHOLESTEROL: 235 mg/dL — AB (ref <200)
HDL: 99 mg/dL (ref >50)
LDL Cholesterol (Calc): 120 mg/dL (calc) — ABNORMAL HIGH
Non-HDL Cholesterol (Calc): 136 mg/dL (calc) — ABNORMAL HIGH (ref <130)
Total CHOL/HDL Ratio: 2.4 (calc) (ref <5.0)
Triglycerides: 67 mg/dL (ref <150)

## 2016-10-09 LAB — BASIC METABOLIC PANEL
BUN: 13 mg/dL (ref 7–25)
CALCIUM: 9.3 mg/dL (ref 8.6–10.4)
CHLORIDE: 103 mmol/L (ref 98–110)
CO2: 24 mmol/L (ref 20–32)
Creat: 0.79 mg/dL (ref 0.50–0.99)
Glucose, Bld: 86 mg/dL (ref 65–99)
Potassium: 4 mmol/L (ref 3.5–5.3)
Sodium: 139 mmol/L (ref 135–146)

## 2016-10-09 LAB — HEPATIC FUNCTION PANEL
AG Ratio: 1.8 (calc) (ref 1.0–2.5)
ALKALINE PHOSPHATASE (APISO): 75 U/L (ref 33–130)
ALT: 26 U/L (ref 6–29)
AST: 27 U/L (ref 10–35)
Albumin: 4.4 g/dL (ref 3.6–5.1)
BILIRUBIN INDIRECT: 0.7 mg/dL (ref 0.2–1.2)
Bilirubin, Direct: 0.2 mg/dL (ref 0.0–0.2)
GLOBULIN: 2.4 g/dL (ref 1.9–3.7)
TOTAL PROTEIN: 6.8 g/dL (ref 6.1–8.1)
Total Bilirubin: 0.9 mg/dL (ref 0.2–1.2)

## 2016-10-09 LAB — TSH: TSH: 2.04 m[IU]/L (ref 0.40–4.50)

## 2016-10-09 NOTE — Progress Notes (Signed)
Subjective:    Patient ID: Denise Bradley, female    DOB: 10-Oct-1950, 66 y.o.   MRN: [id number]  HPI  Patient presents for yearly preventative medicine examination. She is a pleasant 66 year old female who  has a past medical history of Depression and Hyperlipidemia.  She takes Wellbutrin 300 mg daily for Depression and Xanax 0.5 mg PRN QHS   All immunizations and health maintenance protocols were reviewed with the patient and needed orders were placed. Is due for flu vaccination   Appropriate screening laboratory values were ordered for the patient including screening of hyperlipidemia, renal function and hepatic function.  Medication reconciliation,  past medical history, social history, problem list and allergies were reviewed in detail with the patient  Goals were established with regard to weight loss, exercise, and  diet in compliance with medications. She tries to eat healthy but does not exercise on a regular basis  She is up to date on her mammogram, dental visits, and eye exam. She does self breast exams at home. She will see her GYN for her pap smear. Her last colonoscopy was in 2015.   She is having foot surgery next week.   She has no acute issues she would like to discuss today    Review of Systems  Constitutional: Negative.   HENT: Negative.   Eyes: Negative.   Respiratory: Negative.   Cardiovascular: Negative.   Gastrointestinal: Negative.   Endocrine: Negative.   Genitourinary: Negative.   Musculoskeletal: Negative.   Skin: Negative.   Allergic/Immunologic: Negative.   Neurological: Negative.   Hematological: Negative.   Psychiatric/Behavioral: Negative.   All other systems reviewed and are negative.  Past Medical History:  Diagnosis Date  . Depression   . Hyperlipidemia     Social History   Social History  . Marital status: Widowed    Spouse name: N/A  . Number of children: N/A  . Years of education: N/A   Occupational History  . Not  on file.   Social History Main Topics  . Smoking status: Never Smoker  . Smokeless tobacco: Never Used  . Alcohol use 1.5 oz/week    3 drink(s) per week  . Drug use: No  . Sexual activity: Not on file   Other Topics Concern  . Not on file   Social History Narrative   Retired - Worked with [place] and last May she was laid off. She worked there for 22 years   Going back to school for [employer].    No pets   Three children ( [place], [place]. [place] ( Son and two daughters- all married. & grandchildren)   States " Does not do anything fun".          Diet: Eat healthy, does not eat fast food.    Exercise: Does not exercise, does not feel like she has time.           Past Surgical History:  Procedure Laterality Date  . COLONOSCOPY    . FOOT SURGERY  01/15/2012   right  . FOOT SURGERY  July 2014   right  . KNEE ARTHROSCOPY    . LIPOSUCTION  5/05    Family History  Problem Relation Age of Onset  . Diabetes Father   . Hypertension Father   . Heart disease Father        CABG  . Cancer Mother 24       cancer unknown primary  . COPD Mother   .  Breast cancer Paternal Aunt   . Colon cancer Neg Hx     Allergies  Allergen Reactions  . Acetaminophen Anaphylaxis    REACTION: swells throat shut/can't breathe  . Clindamycin/Lincomycin Other (See Comments)    Cause Loose Stools// Diarrhea    Current Outpatient Prescriptions on File Prior to Visit  Medication Sig Dispense Refill  . ALPRAZolam (XANAX) 0.5 MG tablet Take 1 tablet (0.5 mg total) by mouth at bedtime as needed for anxiety. 30 tablet 0  . buPROPion (WELLBUTRIN XL) 300 MG 24 hr tablet TAKE 1 TABLET DAILY 90 tablet 0  . Cholecalciferol (VITAMIN D PO) Take 1 tablet by mouth daily.    . Multiple Vitamins-Minerals (WOMENS BONE HEALTH PO) Take 1 tablet by mouth daily.     No current facility-administered medications on file prior to visit.     BP 116/80   Pulse 67   Temp 98.2 F (36.8 C)   Ht 5' (1.524  m)   Wt 120 lb (54.4 kg)   SpO2 98%   BMI 23.44 kg/m       Objective:   Physical Exam  Constitutional: She is oriented to person, place, and time. She appears well-developed and well-nourished. No distress.  HENT:  Head: Normocephalic and atraumatic.  Right Ear: External ear normal.  Left Ear: External ear normal.  Nose: Nose normal.  Mouth/Throat: Oropharynx is clear and moist. No oropharyngeal exudate.  Eyes: Conjunctivae are normal. Right eye exhibits no discharge. Left eye exhibits no discharge.  Neck: Normal range of motion. Neck supple. No JVD present. No tracheal deviation present. No thyromegaly present.  Cardiovascular: Normal rate, regular rhythm, normal heart sounds and intact distal pulses.  Exam reveals no gallop and no friction rub.   No murmur heard. Pulmonary/Chest: Effort normal and breath sounds normal. No stridor. No respiratory distress. She has no wheezes. She has no rales. She exhibits no tenderness.  Abdominal: Soft. Bowel sounds are normal. She exhibits no distension and no mass. There is no tenderness. There is no rebound and no guarding.  Musculoskeletal: Normal range of motion. She exhibits no edema, tenderness or deformity.  Lymphadenopathy:    She has no cervical adenopathy.  Neurological: She is alert and oriented to person, place, and time. She has normal reflexes. She displays normal reflexes. No cranial nerve deficit. She exhibits normal muscle tone. Coordination normal.  Skin: Skin is warm and dry. No rash noted. She is not diaphoretic. No erythema. No pallor.  Psychiatric: She has a normal mood and affect. Her behavior is normal. Judgment and thought content normal.  Nursing note and vitals reviewed.     Assessment & Plan:  1. Routine general medical examination at a health care facility - Follow up in one year  - Encouraged routine exercise  - Basic metabolic panel; Future - CBC with Differential/Platelet; Future - Hepatic function panel;  Future - Lipid panel; Future - TSH; Future  2. Hyperlipidemia, unspecified hyperlipidemia type - Consider statin  - Basic metabolic panel; Future - CBC with Differential/Platelet; Future - Hepatic function panel; Future - Lipid panel; Future - TSH; Future  3. Depression, unspecified depression type - Well controlled on current regimen   Dorothyann Peng, NP

## 2016-10-09 NOTE — Patient Instructions (Signed)
It was great seeing you today   I will follow up with you regarding your blood work   I wish you the best on your surgery.

## 2016-10-09 NOTE — Addendum Note (Signed)
Addended by: Clarnce Flock E on: 10/09/2016 10:46 AM   Modules accepted: Orders

## 2016-10-09 NOTE — Addendum Note (Signed)
Addended by: Tomi Likens on: 10/09/2016 10:53 AM   Modules accepted: Orders

## 2016-10-09 NOTE — Addendum Note (Signed)
Addended by: Tomi Likens on: 10/09/2016 10:54 AM   Modules accepted: Orders

## 2016-10-13 ENCOUNTER — Encounter: Payer: Self-pay | Admitting: Podiatry

## 2016-10-13 ENCOUNTER — Telehealth: Payer: Self-pay | Admitting: Podiatry

## 2016-10-13 ENCOUNTER — Telehealth: Payer: Self-pay | Admitting: *Deleted

## 2016-10-13 ENCOUNTER — Telehealth: Payer: Self-pay | Admitting: Adult Health

## 2016-10-13 ENCOUNTER — Other Ambulatory Visit: Payer: Self-pay

## 2016-10-13 DIAGNOSIS — M84871 Other disorders of continuity of bone, right ankle and foot: Secondary | ICD-10-CM | POA: Diagnosis not present

## 2016-10-13 DIAGNOSIS — M21541 Acquired clubfoot, right foot: Secondary | ICD-10-CM | POA: Diagnosis not present

## 2016-10-13 DIAGNOSIS — Z4889 Encounter for other specified surgical aftercare: Secondary | ICD-10-CM | POA: Diagnosis not present

## 2016-10-13 MED ORDER — ATORVASTATIN CALCIUM 10 MG PO TABS: 10.0000 mg | ORAL_TABLET | Freq: Every day | ORAL | 3 refills | Status: DC

## 2016-10-13 MED ORDER — PROMETHAZINE HCL 25 MG PO TABS: 25.0000 mg | ORAL_TABLET | Freq: Three times a day (TID) | ORAL | 0 refills | Status: DC | PRN

## 2016-10-13 MED ORDER — MEPERIDINE HCL 50 MG PO TABS: 50.0000 mg | ORAL_TABLET | ORAL | 0 refills | Status: DC | PRN

## 2016-10-13 MED ORDER — OXYCODONE HCL 5 MG PO TABS: 5.0000 mg | ORAL_TABLET | Freq: Four times a day (QID) | ORAL | 0 refills | Status: DC | PRN

## 2016-10-13 NOTE — Telephone Encounter (Signed)
Pt states can't get demerol filled. I spoke with pt, she states the [facility]gan [facility] and now she is at [facility] and she can not get the Demerol filled because it needs a prior authorization. I told pt she could do self-pay and she states she did not want to pay $40.00 for the Demerol. Pt states she was given a different pain medication that did not have acetaminophen and did not need prior authorization, after the last surgery in 2014. Our clinicals and Meds & Orders for this pt do not go back further than 11/03/2012. I spoke with [facility], she states they only have medication history of 18 months. Doctor on call - Dr. Jacqualyn Posey states order oxycodone 5mg  #28 one tablet every 6 hours. Vianne Bulls states that will be covered without pre-cert. I informed pt of [facility]'s explanation of [facility] history of meds, and that Dr. Jacqualyn Posey had ordered. Pt states she has been sitting in the [facility] parking lot for over a hour and wanted to know if the rx could be called in, I told her no she would need to hand carry the oxycodone rx to [facility], or purchase the Demerol.

## 2016-10-13 NOTE — Progress Notes (Signed)
Patient's mother presents wo the office stating the the prescription written for her daughter will not work because she is allergic to tylenol. Rx originally written for hydrocodone 5/325mg . Per Dr Paulla Dolly new Rx for demerol 50mg  q 4 hrs prn pain #30 and phenergan q 8 hrs prn nausea written. She is to call with any further questions or concerns

## 2016-10-13 NOTE — Telephone Encounter (Signed)
Notes recorded by Beckie Busing, CMA on 10/13/2016 at 3:46 PM EDT Spoke with patient regarding results and recommendations. Patient voiced understanding. Rx sent. Diet and exercise modifications also discussed. No further questions or concerns.

## 2016-10-13 NOTE — Addendum Note (Signed)
Addended by: Roney Jaffe on: 10/13/2016 02:06 PM   Modules accepted: Orders

## 2016-10-13 NOTE — Telephone Encounter (Signed)
Pt called saying she had surgery this morning and her friend took her to her pharmacy to get her Rx's filled. Pt stated she has been in the car for 45 minutes because [facility] which is the pharmacy she uses is saying the pain medication which was printed has already been filled. I told the pt on our end the phenergan was called in to the [facility] and by [place] law pain medication cannot be called in, that they picked it up. Pt requested we call her pharmacy to straighten out the issue.

## 2016-10-13 NOTE — Telephone Encounter (Signed)
Pt is returning misty call concerning blood work result

## 2016-10-14 ENCOUNTER — Telehealth: Payer: Self-pay | Admitting: *Deleted

## 2016-10-14 NOTE — Progress Notes (Signed)
DOS 10/13/2016 Metatarsal Head Resection 5th RT; Sesamoidectomy RT and Removal Fixation Deep Screws RT

## 2016-10-14 NOTE — Telephone Encounter (Signed)
Called patient at [phone] (Home #) to check to see how they were doing from their surgery that was performed on Tuesday, October 13, 2016 with Dr. Paulla Dolly. Pt had an Metatarsal Head Resection 5th RT; Sesamoidectomy RT and Removal Fixation Deep Screws RT.  Pt stated, "Pain is 5/10 when up on foot and trying to move in the boot. Pain medication (Oxycodone without acetaminophen) is not really helping." I asked Valery, RN, if she could up her pain medication and she said no. She needs to take ibuprofen in between taking the Oxycodone. I told patient this and she stated she understood.  Pt also stated, "I am icing my foot and that is helping. I had a really good experience at the [facility] but they need to have clearer instructions on how to wear the boot". Pt is elevating her foot and wearing her boot at all times.  Pt will be coming back for her postop appointment with Dr. Paulla Dolly on Monday, October 19, 2016 at 11:30 am.

## 2016-10-15 ENCOUNTER — Telehealth: Payer: Self-pay | Admitting: Family Medicine

## 2016-10-15 NOTE — Telephone Encounter (Signed)
I spoke to the pt.  She does not want to start Lipitor at this time.  Would like to work on diet and exercise.  Advised that I will speak to [facility] and see what he thinks.  Will call back.

## 2016-10-15 NOTE — Telephone Encounter (Signed)
That is fine 

## 2016-10-15 NOTE — Telephone Encounter (Signed)
Notified pt that she does not need to start Lipitor at this time.  Instructed her to work on diet and exercise.  Pt agreed.

## 2016-10-16 ENCOUNTER — Encounter: Payer: Self-pay | Admitting: Adult Health

## 2016-10-19 ENCOUNTER — Encounter: Payer: Self-pay | Admitting: Podiatry

## 2016-10-19 ENCOUNTER — Ambulatory Visit (INDEPENDENT_AMBULATORY_CARE_PROVIDER_SITE_OTHER): Payer: BLUE CROSS/BLUE SHIELD | Admitting: Podiatry

## 2016-10-19 ENCOUNTER — Ambulatory Visit (INDEPENDENT_AMBULATORY_CARE_PROVIDER_SITE_OTHER): Payer: BLUE CROSS/BLUE SHIELD

## 2016-10-19 VITALS — BP 144/88 | HR 70 | Temp 97.8°F | Resp 16

## 2016-10-19 DIAGNOSIS — M21619 Bunion of unspecified foot: Secondary | ICD-10-CM

## 2016-10-19 DIAGNOSIS — M779 Enthesopathy, unspecified: Secondary | ICD-10-CM

## 2016-10-19 MED ORDER — HYDROCODONE-IBUPROFEN 5-200 MG PO TABS: 1.0000 | ORAL_TABLET | Freq: Three times a day (TID) | ORAL | 0 refills | Status: DC | PRN

## 2016-10-20 NOTE — Progress Notes (Signed)
Subjective:    Patient ID: Denise Bradley, female   DOB: 66 y.o.   MRN: [id number]   HPI patient states she's feeling real well and that her pain continues to reduce and she's not taking the pain medicine today    ROS      Objective:  Physical Exam neurovascular status intact with patient's right foot doing real well with wound edges well coapted and no drainage     Assessment:  Doing well post forefoot surgery right      Plan:    X-rays taken reviewed and allow patient to continue with elevation immobilization compression and gave instructions on suture removal and approximate 2 weeks. Reappoint at that time or earlier if needed  X-rays indicate that there is satisfactory section of bone with good alignment noted

## 2016-11-02 ENCOUNTER — Ambulatory Visit (INDEPENDENT_AMBULATORY_CARE_PROVIDER_SITE_OTHER): Payer: BLUE CROSS/BLUE SHIELD | Admitting: Podiatry

## 2016-11-02 ENCOUNTER — Ambulatory Visit (INDEPENDENT_AMBULATORY_CARE_PROVIDER_SITE_OTHER): Payer: BLUE CROSS/BLUE SHIELD

## 2016-11-02 DIAGNOSIS — M21619 Bunion of unspecified foot: Secondary | ICD-10-CM

## 2016-11-02 DIAGNOSIS — Z472 Encounter for removal of internal fixation device: Secondary | ICD-10-CM | POA: Diagnosis not present

## 2016-11-02 DIAGNOSIS — M216X9 Other acquired deformities of unspecified foot: Secondary | ICD-10-CM

## 2016-11-04 NOTE — Progress Notes (Signed)
Subjective:    Patient ID: Denise Bradley, female   DOB: 66 y.o.   MRN: [id number]   HPI patient states that she's doing well at this time with minimal discomfort    ROS      Objective:  Physical Exam neurovascular status intact with patient doing well with wound edges well coapted with stitches intact plantar and everything else good alignment     Assessment:   Doing well post forefoot reconstruction right      Plan:     Stitches removed conditions discussed and begin gradual increase in weightbearing against foot with surgical shoe. Should be able to return to soft shoes in the next several weeks but continue elevation continue being careful with this and reappoint in 4 weeks or earlier if needed

## 2016-12-02 ENCOUNTER — Ambulatory Visit (INDEPENDENT_AMBULATORY_CARE_PROVIDER_SITE_OTHER): Payer: BLUE CROSS/BLUE SHIELD

## 2016-12-02 ENCOUNTER — Ambulatory Visit (INDEPENDENT_AMBULATORY_CARE_PROVIDER_SITE_OTHER): Payer: BLUE CROSS/BLUE SHIELD | Admitting: Podiatry

## 2016-12-02 ENCOUNTER — Encounter: Payer: Self-pay | Admitting: Podiatry

## 2016-12-02 DIAGNOSIS — M21621 Bunionette of right foot: Secondary | ICD-10-CM | POA: Diagnosis not present

## 2016-12-02 DIAGNOSIS — M21619 Bunion of unspecified foot: Secondary | ICD-10-CM | POA: Diagnosis not present

## 2016-12-02 DIAGNOSIS — M21622 Bunionette of left foot: Secondary | ICD-10-CM | POA: Diagnosis not present

## 2016-12-02 NOTE — Progress Notes (Signed)
Subjective:    Patient ID: Denise Bradley, female   DOB: 66 y.o.   MRN: [id number]   HPI patient presents stating my right foot is still sore but it seems to be improving in the outside stating great and I like to get my left foot fixed before the end of the year due to my timing    ROS      Objective:  Physical Exam neurovascular status intact with well-healing surgical site plantar right first metatarsal right fifth metatarsal with no prominent bone structure and mild edema which is normal for this. Postop. Patient's found to have on the left foot a structural bunion deformity that's painful with redness and chronic discomfort head of the fifth metatarsal with inflammation fluid buildup associated with     Assessment:    Well-healing surgical site right with normal postoperative discomfort edema and structural bunion deformity left with enlargement with metatarsal head left that are symptomatic     Plan:    H&P conditions reviewed at great length and discussed. At this point I do think surgical intervention isin this patient's best interest and she wants to have it done left. At this point I allowed her to read consent form going over the surgery of [place]-type bunionectomy along with metatarsal head resection left and allowed her to read consent form going over alternative treatments complications. Patient wants surgery understanding risk and the recovery can take 6 months to one year with no long-term guarantees. Patient is given all preoperative instructions currently

## 2016-12-02 NOTE — Patient Instructions (Signed)
Pre-Operative Instructions  Congratulations, you have decided to take an important step towards improving your quality of life.  You can be assured that the doctors and staff at [facility] will be with you every step of the way.  Here are some important things you should know:  1. Plan to be at the surgery center/hospital at least 1 (one) hour prior to your scheduled time, unless otherwise directed by the surgical center/hospital staff.  You must have a responsible adult accompany you, remain during the surgery and drive you home.  Make sure you have directions to the surgical center/hospital to ensure you arrive on time. 2. If you are having surgery at [facility] or [facility], you will need a copy of your medical history and physical form from your family physician within one month prior to the date of surgery. We will give you a form for your primary physician to complete.  3. We make every effort to accommodate the date you request for surgery.  However, there are times where surgery dates or times have to be moved.  We will contact you as soon as possible if a change in schedule is required.   4. No aspirin/ibuprofen for one week before surgery.  If you are on aspirin, any non-steroidal anti-inflammatory medications (Mobic, Aleve, Ibuprofen) should not be taken seven (7) days prior to your surgery.  You make take Tylenol for pain prior to surgery.  5. Medications - If you are taking daily heart and blood pressure medications, seizure, reflux, allergy, asthma, anxiety, pain or diabetes medications, make sure you notify the surgery center/hospital before the day of surgery so they can tell you which medications you should take or avoid the day of surgery. 6. No food or drink after midnight the night before surgery unless directed otherwise by surgical center/hospital staff. 7. No alcoholic beverages 24-hours prior to surgery.  No smoking 24-hours prior or 24-hours after  surgery. 8. Wear loose pants or shorts. They should be loose enough to fit over bandages, boots, and casts. 9. Don't wear slip-on shoes. Sneakers are preferred. 10. Bring your boot with you to the surgery center/hospital.  Also bring crutches or a walker if your physician has prescribed it for you.  If you do not have this equipment, it will be provided for you after surgery. 11. If you have not been contacted by the surgery center/hospital by the day before your surgery, call to confirm the date and time of your surgery. 12. Leave-time from work may vary depending on the type of surgery you have.  Appropriate arrangements should be made prior to surgery with your employer. 13. Prescriptions will be provided immediately following surgery by your doctor.  Fill these as soon as possible after surgery and take the medication as directed. Pain medications will not be refilled on weekends and must be approved by the doctor. 14. Remove nail polish on the operative foot and avoid getting pedicures prior to surgery. 15. Wash the night before surgery.  The night before surgery wash the foot and leg well with water and the antibacterial soap provided. Be sure to pay special attention to beneath the toenails and in between the toes.  Wash for at least three (3) minutes. Rinse thoroughly with water and dry well with a towel.  Perform this wash unless told not to do so by your physician.  Enclosed: 1 Ice pack (please put in freezer the night before surgery)   1 Hibiclens skin cleaner     Pre-op instructions  If you have any questions regarding the instructions, please do not hesitate to call our office.  [place]: [address], [place], [place] [address] -- [phone]  [place]: [address] [place], [place] [address] -- [phone]  [place]: [address]  [place], [place] [address] -- [phone]  [place]: [address], [address], [place],  [address] -- [phone]  Website:  [email or web address] 

## 2016-12-04 ENCOUNTER — Telehealth: Payer: Self-pay | Admitting: Podiatry

## 2016-12-04 NOTE — Telephone Encounter (Signed)
I have surgery scheduled for the Tuesday after Thanksgiving. I have some questions about this. Can you please call me back to discuss. My number is [phone]. Thank you.

## 2016-12-07 ENCOUNTER — Telehealth: Payer: Self-pay | Admitting: Podiatry

## 2016-12-07 NOTE — Telephone Encounter (Signed)
I called on Friday and never received a phone call back so I'm calling again. I'm scheduled for surgery the Tuesday after Thanksgiving. I have some questions that I would like answered. So if you could please call me back at [phone]. Thank you.

## 2016-12-08 NOTE — Telephone Encounter (Signed)
I am attempting to return your call.  Please give me a call back.

## 2016-12-09 ENCOUNTER — Telehealth: Payer: Self-pay | Admitting: *Deleted

## 2016-12-09 NOTE — Telephone Encounter (Signed)
"  You called me yesterday.  I wanted to talk to you about my surgery coming up Tuesday after Thanksgiving.  I have some questions.  Sorry we keep missing each other.  Can you try to call me back.  Thank you."

## 2016-12-10 NOTE — Telephone Encounter (Signed)
I'm returning your call.  You have some questions.  "I don't know if you will be able to help me or not.  I may need to speak to a nurse.  When I was in there we did not discuss the bunion on the side of my foot, on the little toe side.  I want to make sure he takes that off as well because I don't want to have to have another surgery."  He has you scheduled for a Metatarsal Head Resection.  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  Your problem will be taken care of.  "I don't know what you are talking about.  What you are saying has no meaning to me.  I have this bunion on the side of my little toe.  I had the procedure done on my right foot years ago, I didn't just have it in September.  This was done years ago.  "You had a Sesamoid removed years ago, that's a bone found under the big toe area.  The surgery that Dr. Paulla Dolly has you scheduled for will take care of the bone that is sticking out on your left foot.  "Can I speak to a nurse or see Dr. Paulla Dolly again to discuss this because you are not answering my question.  Maybe a nurse will be able to help me better."  I can send you to a scheduler to make an appointment with Dr. Paulla Dolly or you can leave a message for the nurse and she can call you back.  "I'll leave a message for the nurse, thank you."  (She's having an Altamese [place] and a Metatarsal Head Resection 5th Lt.  Her diagnoses are Hallux abducto Valgus and Tailors Bunion)

## 2016-12-10 NOTE — Telephone Encounter (Signed)
Left message informing pt that I am a RN and I had spoken with Dr. Paulla Dolly, he stated he would not be cutting the callous out, but the procedure to cut the bone on the outer metatarsal and move would resolve the cause of the callous, if she would like to call again she could call [phone], or if she would like to discuss with Dr. Paulla Dolly call for an appt [phone].

## 2016-12-21 ENCOUNTER — Telehealth: Payer: Self-pay | Admitting: Podiatry

## 2016-12-21 NOTE — Telephone Encounter (Signed)
I am scheduled to have surgery tomorrow by Dr. Paulla Dolly. I had some questions. I was congested over the weekend and I took some ibuprofen although it says not to take ibuprofen. I wanted to make sure I was following directions correctly. Please call me back at [phone]. Thank you.

## 2016-12-22 ENCOUNTER — Telehealth: Payer: Self-pay | Admitting: *Deleted

## 2016-12-22 ENCOUNTER — Encounter: Payer: Self-pay | Admitting: Podiatry

## 2016-12-22 ENCOUNTER — Encounter: Payer: Self-pay | Admitting: Adult Health

## 2016-12-22 ENCOUNTER — Ambulatory Visit: Payer: BLUE CROSS/BLUE SHIELD | Admitting: Adult Health

## 2016-12-22 VITALS — BP 142/90 | Temp 98.3°F | Wt 120.0 lb

## 2016-12-22 DIAGNOSIS — J0140 Acute pansinusitis, unspecified: Secondary | ICD-10-CM | POA: Diagnosis not present

## 2016-12-22 MED ORDER — FLUTICASONE PROPIONATE 50 MCG/ACT NA SUSP: 2.0000 | Freq: Every day | NASAL | 6 refills | Status: DC

## 2016-12-22 MED ORDER — DOXYCYCLINE HYCLATE 100 MG PO CAPS: 100.0000 mg | ORAL_CAPSULE | Freq: Two times a day (BID) | ORAL | 0 refills | Status: DC

## 2016-12-22 NOTE — Telephone Encounter (Signed)
"  This is Denise Bradley.  I just missed your call.  I didn't want to take the risk of going under anesthesia with this congestion."  I left you a message stating that I rescheduled your surgery to December 4.  "Okay, that will be fine.  What time will I need to be there?"  It will be sometime that morning.  Someone from the surgical center will call you the Friday or Monday prior to your surgery date with the arrival time.  "They'll call me the Monday before my surgery date?  Can Dr. Paulla Dolly prescribe me an antibiotic so I can get rid of this sinus infection before my surgery?"  No, he cannot prescribe you an antibiotic for a sinus infection.  "Okay, I will call my primary care physician."

## 2016-12-22 NOTE — Telephone Encounter (Signed)
I left the patient a message that I was returning her call.  I also informed her that I was rescheduling her appointment to 12/29/16.  I asked her to call if this date is not good for her.  I rescheduled the surgery with Caren Griffins at [facility].

## 2016-12-22 NOTE — Progress Notes (Signed)
Subjective:    Patient ID: Denise Bradley, female    DOB: 1950/04/06, 66 y.o.   MRN: [id number]  Sinusitis  This is a new problem. The current episode started in the past 7 days. The problem has been gradually worsening since onset. There has been no fever. Associated symptoms include chills, congestion, coughing, headaches, shortness of breath and sinus pressure. Pertinent negatives include no diaphoresis, ear pain or sore throat.      Review of Systems  Constitutional: Positive for chills. Negative for diaphoresis.  HENT: Positive for congestion, postnasal drip, rhinorrhea, sinus pressure and sinus pain. Negative for ear pain and sore throat.   Eyes: Negative.   Respiratory: Positive for cough and shortness of breath.   Cardiovascular: Negative.   Neurological: Positive for headaches. Negative for dizziness.   Past Medical History:  Diagnosis Date  . Depression   . Hyperlipidemia     Social History   Socioeconomic History  . Marital status: Widowed    Spouse name: Not on file  . Number of children: Not on file  . Years of education: Not on file  . Highest education level: Not on file  Social Needs  . Financial resource strain: Not on file  . Food insecurity - worry: Not on file  . Food insecurity - inability: Not on file  . Transportation needs - medical: Not on file  . Transportation needs - non-medical: Not on file  Occupational History  . Not on file  Tobacco Use  . Smoking status: Never Smoker  . Smokeless tobacco: Never Used  Substance and Sexual Activity  . Alcohol use: Yes    Alcohol/week: 1.5 oz    Types: 3 drink(s) per week  . Drug use: No  . Sexual activity: Not on file  Other Topics Concern  . Not on file  Social History Narrative   Retired - Worked with [employer] and last May she was laid off. She worked there for 22 years   Going back to school for [employer].    No pets   Three children ( [place], [place]. [place] ( Son and two  daughters- all married. & grandchildren)   States " Does not do anything fun".          Diet: Eat healthy, does not eat fast food.    Exercise: Does not exercise, does not feel like she has time.        Past Surgical History:  Procedure Laterality Date  . COLONOSCOPY    . FOOT SURGERY  01/15/2012   right  . FOOT SURGERY  July 2014   right  . KNEE ARTHROSCOPY    . LIPOSUCTION  5/05    Family History  Problem Relation Age of Onset  . Diabetes Father   . Hypertension Father   . Heart disease Father        CABG  . Cancer Mother 61       cancer unknown primary  . COPD Mother   . Breast cancer Paternal Aunt   . Colon cancer Neg Hx     Allergies  Allergen Reactions  . Acetaminophen Anaphylaxis    REACTION: swells throat shut/can't breathe  . Clindamycin/Lincomycin Other (See Comments)    Cause Loose Stools// Diarrhea    Current Outpatient Medications on File Prior to Visit  Medication Sig Dispense Refill  . ALPRAZolam (XANAX) 0.5 MG tablet Take 1 tablet (0.5 mg total) by mouth at bedtime as needed for anxiety. 30 tablet 0  .  buPROPion (WELLBUTRIN XL) 300 MG 24 hr tablet TAKE 1 TABLET DAILY 90 tablet 0  . Cholecalciferol (VITAMIN D PO) Take 1 tablet by mouth daily.    . Multiple Vitamins-Minerals (WOMENS BONE HEALTH PO) Take 1 tablet by mouth daily.     No current facility-administered medications on file prior to visit.     BP (!) 142/90 (BP Location: Left Arm)   Temp 98.3 F (36.8 C) (Oral)   Wt 120 lb (54.4 kg)   BMI 23.44 kg/m       Objective:   Physical Exam  Constitutional: She is oriented to person, place, and time. She appears well-developed and well-nourished. No distress.  HENT:  Head: Normocephalic and atraumatic.  Right Ear: Hearing, external ear and ear canal normal. Tympanic membrane is bulging. Tympanic membrane is not erythematous.  Left Ear: Hearing, external ear and ear canal normal. Tympanic membrane is bulging. Tympanic membrane is not  erythematous.  Nose: Mucosal edema and rhinorrhea present. Right sinus exhibits maxillary sinus tenderness and frontal sinus tenderness. Left sinus exhibits maxillary sinus tenderness and frontal sinus tenderness.  Mouth/Throat: Uvula is midline, oropharynx is clear and moist and mucous membranes are normal. No oropharyngeal exudate.  Cardiovascular: Normal rate, regular rhythm, normal heart sounds and intact distal pulses. Exam reveals no gallop and no friction rub.  No murmur heard. Pulmonary/Chest: Effort normal and breath sounds normal. No respiratory distress. She has no wheezes. She has no rales. She exhibits no tenderness.  Neurological: She is alert and oriented to person, place, and time.  Skin: Skin is warm and dry. No rash noted. No erythema. No pallor.  Psychiatric: She has a normal mood and affect. Her behavior is normal. Judgment and thought content normal.  Nursing note and vitals reviewed.     Assessment & Plan:  1. Acute non-recurrent pansinusitis - doxycycline (VIBRAMYCIN) 100 MG capsule; Take 1 capsule (100 mg total) by mouth 2 (two) times daily.  Dispense: 14 capsule; Refill: 0 - fluticasone (FLONASE) 50 MCG/ACT nasal spray; Place 2 sprays into both nostrils daily.  Dispense: 16 g; Refill: 6 - stay hydrated and rest  - Follow up in 3-4 days if no improvement   Dorothyann Peng, NP

## 2016-12-24 ENCOUNTER — Other Ambulatory Visit: Payer: Self-pay | Admitting: Adult Health

## 2016-12-24 NOTE — Telephone Encounter (Signed)
Sent to the pharmacy by e-scribe. 

## 2016-12-29 DIAGNOSIS — M2012 Hallux valgus (acquired), left foot: Secondary | ICD-10-CM

## 2016-12-29 DIAGNOSIS — M21542 Acquired clubfoot, left foot: Secondary | ICD-10-CM

## 2016-12-30 ENCOUNTER — Other Ambulatory Visit: Payer: BLUE CROSS/BLUE SHIELD

## 2016-12-30 ENCOUNTER — Telehealth: Payer: Self-pay | Admitting: Podiatry

## 2016-12-30 NOTE — Telephone Encounter (Signed)
I had foot surgery yesterday. I'm having problems with this pain medication. It has not kicked in so I thought I would call and discuss this with your. Please call me back at [phone]. Thank you.

## 2016-12-30 NOTE — Telephone Encounter (Signed)
Informed patient to loosen ace bandage and take motrin as directed along with pain medication if needed to help with post op pain. She is to call back with any acute symptom changes

## 2017-01-06 ENCOUNTER — Encounter: Payer: BLUE CROSS/BLUE SHIELD | Admitting: Obstetrics and Gynecology

## 2017-01-06 ENCOUNTER — Ambulatory Visit (INDEPENDENT_AMBULATORY_CARE_PROVIDER_SITE_OTHER): Payer: BLUE CROSS/BLUE SHIELD

## 2017-01-06 ENCOUNTER — Encounter: Payer: Self-pay | Admitting: Obstetrics & Gynecology

## 2017-01-06 ENCOUNTER — Ambulatory Visit (INDEPENDENT_AMBULATORY_CARE_PROVIDER_SITE_OTHER): Payer: BLUE CROSS/BLUE SHIELD | Admitting: Obstetrics & Gynecology

## 2017-01-06 ENCOUNTER — Encounter: Payer: Self-pay | Admitting: Podiatry

## 2017-01-06 ENCOUNTER — Ambulatory Visit (INDEPENDENT_AMBULATORY_CARE_PROVIDER_SITE_OTHER): Payer: BLUE CROSS/BLUE SHIELD | Admitting: Podiatry

## 2017-01-06 ENCOUNTER — Other Ambulatory Visit (HOSPITAL_COMMUNITY)
Admission: RE | Admit: 2017-01-06 | Discharge: 2017-01-06 | Disposition: A | Discharge status: Home / Self Care | Payer: BLUE CROSS/BLUE SHIELD | Source: Ambulatory Visit | Attending: Obstetrics and Gynecology | Admitting: Obstetrics and Gynecology

## 2017-01-06 VITALS — BP 140/82 | HR 72 | Resp 16 | Ht 59.0 in | Wt 121.0 lb

## 2017-01-06 VITALS — BP 150/78 | HR 76 | Temp 96.4°F | Resp 16

## 2017-01-06 DIAGNOSIS — M8580 Other specified disorders of bone density and structure, unspecified site: Secondary | ICD-10-CM

## 2017-01-06 DIAGNOSIS — M21621 Bunionette of right foot: Secondary | ICD-10-CM | POA: Diagnosis not present

## 2017-01-06 DIAGNOSIS — Z124 Encounter for screening for malignant neoplasm of cervix: Secondary | ICD-10-CM

## 2017-01-06 DIAGNOSIS — M21622 Bunionette of left foot: Secondary | ICD-10-CM

## 2017-01-06 DIAGNOSIS — Z01419 Encounter for gynecological examination (general) (routine) without abnormal findings: Secondary | ICD-10-CM

## 2017-01-06 DIAGNOSIS — M21619 Bunion of unspecified foot: Secondary | ICD-10-CM

## 2017-01-06 NOTE — Progress Notes (Addendum)
66 y.o. [id number] WidowedCaucasianF here for annual exam.  PMP, no vaginal bleeding.  Was a former patient of Dr. Joan Flores.  Has no vaginal bleeding or other complaints.   No LMP recorded. Patient is postmenopausal.          Sexually active: No.  The current method of family planning is abstinence and post menopausal status.    Exercising: No.  The patient does not participate in regular exercise at present. Smoker:  no  Health Maintenance: Pap:  06/06/08 neg   History of abnormal Pap:  no MMG:  10/05/16 BIRADS1:neg Colonoscopy:  04/2013 f/u 10 years, Dr. Carlean Purl. BMD:   2006 Osteopenia  TDaP:  04/2014  Pneumonia vaccine(s):  N/A Zostavax:   2017  Hep C testing: No Screening Labs: 09/2016    reports that  has never smoked. she has never used smokeless tobacco. She reports that she drinks about 4.2 oz of alcohol per week. She reports that she does not use drugs.  Past Medical History:  Diagnosis Date  . Depression   . Hyperlipidemia     Past Surgical History:  Procedure Laterality Date  . COLONOSCOPY    . FOOT SURGERY  01/15/2012   right  . FOOT SURGERY  July 2014   right  . KNEE ARTHROSCOPY    . LIPOSUCTION  5/05    Current Outpatient Medications  Medication Sig Dispense Refill  . ALPRAZolam (XANAX) 0.5 MG tablet Take 1 tablet (0.5 mg total) by mouth at bedtime as needed for anxiety. 30 tablet 0  . buPROPion (WELLBUTRIN XL) 300 MG 24 hr tablet TAKE 1 TABLET DAILY 90 tablet 2  . Cholecalciferol (VITAMIN D PO) Take 1 tablet by mouth daily.    . Multiple Vitamins-Minerals (WOMENS BONE HEALTH PO) Take 1 tablet by mouth daily.     No current facility-administered medications for this visit.     Family History  Problem Relation Age of Onset  . Diabetes Father   . Hypertension Father   . Heart disease Father        CABG  . Cancer Mother 65       cancer unknown primary  . COPD Mother   . Breast cancer Paternal Aunt   . Colon cancer Neg Hx     ROS:  Pertinent items are  noted in HPI.  Otherwise, a comprehensive ROS was negative.  Exam:   BP 140/82 (BP Location: Right Arm, Patient Position: Sitting, Cuff Size: Normal)   Pulse 72   Resp 16   Ht 4\' 11"  (1.499 m)   Wt 121 lb (54.9 kg)   BMI 24.44 kg/m     Height: 4\' 11"  (149.9 cm)  Ht Readings from Last 3 Encounters:  01/06/17 4\' 11"  (1.499 m)  10/09/16 5' (1.524 m)  02/10/16 5\' 1"  (1.549 m)    General appearance: alert, cooperative and appears stated age Head: Normocephalic, without obvious abnormality, atraumatic Neck: no adenopathy, supple, symmetrical, trachea midline and thyroid normal to inspection and palpation Lungs: clear to auscultation bilaterally Breasts: normal appearance, no masses or tenderness Heart: regular rate and rhythm Abdomen: soft, non-tender; bowel sounds normal; no masses,  no organomegaly Extremities: extremities normal, atraumatic, no cyanosis or edema Skin: Skin color, texture, turgor normal. No rashes or lesions Lymph nodes: Cervical, supraclavicular, and axillary nodes normal. No abnormal inguinal nodes palpated Neurologic: Grossly normal   Pelvic: External genitalia:  no lesions              Urethra:  normal  appearing urethra with no masses, tenderness or lesions              Bartholins and Skenes: normal                 Vagina: normal appearing vagina with normal color and discharge, no lesions              Cervix: no lesions              Pap taken: Yes.   Bimanual Exam:  Uterus:  normal size, contour, position, consistency, mobility, non-tender              Adnexa: normal adnexa and no mass, fullness, tenderness               Rectovaginal: Confirms               Anus:  normal sphincter tone, no lesions  Chaperone was present for exam.  A:  Well Woman with normal exam PMP, no HRT Osteopenia Mildly elevated lipids  P:   Mammogram guidelines reviewed. pap smear and HR HPV obtained today  Order for BMD placed Lab work and vaccines UTD except shingrix.  Did  discuss shingles vaccines today.  Order given.   return annually or prn

## 2017-01-07 NOTE — Progress Notes (Signed)
Subjective:   Patient ID: Denise Bradley, female   DOB: 66 y.o.   MRN: [id number]   HPI Patient states doing really well with left foot and is able to walk without much pain in his not being compliant and admits that she is walked on her foot without any form of immobilization.   ROS      Objective:  Physical Exam  Neurovascular status intact negative Homans sign was noted with patient's left foot be mildly swollen where she has been somewhat traumatic by walking on her foot without any form of immobilization.     Assessment:  H&P condition reviewed and at this point recommended continued immobilization of the left foot and explained the importance along with range of motion exercises compression elevation.  Reappoint in 2-3 weeks or earlier if needed.  X-ray indicates there may be very slight movement around the first MPJ where she was not compliant and walked on her foot without any form of immobilization.  It is very stable and should do fine with her being immobilized at the current time     Plan:  Overall doing fine and foot looks good which was conveyed to patient

## 2017-01-08 LAB — CYTOLOGY - PAP: HPV (WINDOPATH): DETECTED — AB

## 2017-01-13 ENCOUNTER — Telehealth: Payer: Self-pay | Admitting: *Deleted

## 2017-01-13 DIAGNOSIS — R8789 Other abnormal findings in specimens from female genital organs: Secondary | ICD-10-CM

## 2017-01-13 DIAGNOSIS — R87618 Other abnormal cytological findings on specimens from cervix uteri: Secondary | ICD-10-CM

## 2017-01-13 NOTE — Telephone Encounter (Signed)
Routing to Dr. Lestine Box.   Colposcopy order placed.   Notes recorded by Burnice Logan, RN on 01/13/2017 at 1:45 PM EST Spoke with patient, advised as seen below per Dr. Sabra Heck. Explanation of colpo provided, questions answered. Contraceptive, postmenopausal. Colpo scheduled for 02/02/17 at 10am with Dr. Sabra Heck. Patient has concerns about hpv, questions answered, still has multiple concerns regarding positive hpv at her age and not being SA. Offered OV with Dr. Sabra Heck for further discussion, patient declined, will discuss day of colpo. Advised to take Motrin 800 mg with food and water one hour before procedure. Patient verbalizes understanding and is agreeable. See telephone encounter dated 01/13/17, forwarding to provider FYI. ------  Notes recorded by Megan Salon, MD on 01/08/2017 at 5:28 PM EST Pleaes let pt know her pap was abnormal and the HR HPV was positive. Needs a colposcopy.      Cc: Denise Bradley

## 2017-01-20 ENCOUNTER — Encounter: Payer: Self-pay | Admitting: Podiatry

## 2017-01-20 ENCOUNTER — Ambulatory Visit (INDEPENDENT_AMBULATORY_CARE_PROVIDER_SITE_OTHER): Payer: BLUE CROSS/BLUE SHIELD | Admitting: Podiatry

## 2017-01-20 DIAGNOSIS — M21622 Bunionette of left foot: Secondary | ICD-10-CM

## 2017-01-20 DIAGNOSIS — M21621 Bunionette of right foot: Secondary | ICD-10-CM

## 2017-01-20 DIAGNOSIS — M216X9 Other acquired deformities of unspecified foot: Secondary | ICD-10-CM

## 2017-01-22 ENCOUNTER — Ambulatory Visit
Admission: RE | Admit: 2017-01-22 | Discharge: 2017-01-22 | Disposition: A | Discharge status: Home / Self Care | Payer: BLUE CROSS/BLUE SHIELD | Source: Ambulatory Visit | Attending: Obstetrics & Gynecology | Admitting: Obstetrics & Gynecology

## 2017-01-22 DIAGNOSIS — M8580 Other specified disorders of bone density and structure, unspecified site: Secondary | ICD-10-CM

## 2017-01-23 NOTE — Progress Notes (Signed)
  Subjective:  Patient ID: Denise Bradley, female    DOB: Jun 12, 1950,  MRN: [id number]  Chief Complaint  Patient presents with  . Routine Post Op    doing well, my pain is improving, and i am wearing the boot or shoe all the time   DOS: 12/29/16 Procedure: [place] bunionectomy left foot, metatarsal head resection left fifth  66 y.o. female returns for post-op check. Denies N/V/F/Ch. Pain is controlled with current medications. States that she is wearing the boot or shoe alternatingly.  Pain is improving.  Objective:   General AA&O x3. Normal mood and affect.  Vascular Foot warm and well perfused.  Neurologic Gross sensation intact.  Dermatologic Skin healing well without signs of infection. Skin edges well coapted without signs of infection.  Orthopedic: Tenderness to palpation noted about the surgical site.    Assessment & Plan:  Patient was evaluated and treated and all questions answered.  S/p left also bunionectomy, metatarsal head resection fifth -Progressing as expected post-operatively. -Sutures: Removed -Medications refilled: None -Foot redressed. -Continue ambulating in surgical shoe  Return in about 2 weeks (around 02/03/2017) for regal patient, put on nurse schedule.

## 2017-02-02 ENCOUNTER — Other Ambulatory Visit: Payer: Self-pay

## 2017-02-02 ENCOUNTER — Ambulatory Visit: Payer: Medicare HMO | Admitting: Obstetrics & Gynecology

## 2017-02-02 ENCOUNTER — Telehealth: Payer: Self-pay | Admitting: Obstetrics & Gynecology

## 2017-02-02 ENCOUNTER — Encounter: Payer: Self-pay | Admitting: Obstetrics & Gynecology

## 2017-02-02 DIAGNOSIS — R87618 Other abnormal cytological findings on specimens from cervix uteri: Secondary | ICD-10-CM

## 2017-02-02 DIAGNOSIS — R69 Illness, unspecified: Secondary | ICD-10-CM | POA: Diagnosis not present

## 2017-02-02 DIAGNOSIS — R87610 Atypical squamous cells of undetermined significance on cytologic smear of cervix (ASC-US): Secondary | ICD-10-CM | POA: Diagnosis not present

## 2017-02-02 DIAGNOSIS — R8789 Other abnormal findings in specimens from female genital organs: Secondary | ICD-10-CM

## 2017-02-02 NOTE — Telephone Encounter (Signed)
Dr.Miller, okay to advise patient to take 1000 IU of Vitamin D3 daily, calcium 600 mg twice daily. Please advise on folic acid.

## 2017-02-02 NOTE — Telephone Encounter (Signed)
Patient was seen today but her new medication instruction did not print out on her AVS. She'd like the new instructions for her vitamin D, calcium, and folic acid sent to her through [facility].

## 2017-02-02 NOTE — Progress Notes (Addendum)
67 y.o. G61P3 Widowed Caucasian female here for colposcopy with possible biopsies and/or ECC due to ASUCS Pap with +HR HPV obtained 01/06/17.  This was first pap smear for pt in over four years.  She'd never had HPV testing done previous to this test.  This is first abnormal pap smear for this patient.    She is "completely floored" by these results.  She'd done extensive research on the internet and has a list of questions.  Wants to know how she could have HPV when she's only had one prior partner, her husband, who is now deceased.  Nature of HR HPV discussed.  Pt admits she had a love-less marriage and was not very sexually active in her [place] so to have three children by her own admission.  There were many issues.  She cried when discussing this with me.  Feels her husband likely had relationships that he did not discuss with her.  Pt does have hx of genital warts or HSV.  Keeps discussing them as if they are the same virus.  Tried to help pt really understand the difference.  Has old records from prior gyn and will review when gets home.  Will likely have additional questions.  Advised pt I could help review these as well, if desired.    Does feels she would benefit from some psychotherapy.  This + HR HPV testing has really brought up a lot of marital issues for her.  Prior evaluation/treatment:  none.  No LMP recorded. Patient is postmenopausal.          Sexually active: No.  The current method of family planning is post menopausal status.     Patient has been counseled about results and procedure.  Risks and benefits have bene reviewed including immediate and/or delayed bleeding, infection, cervical scaring from procedure, possibility of needing additional follow up as well as treatment.  rare risks of missing a lesion discussed as well.  All questions answered.  Pt ready to proceed.  BP (!) 146/98 (BP Location: Right Arm, Patient Position: Sitting, Cuff Size: Normal)   Pulse 76    Resp 16   Physical Exam  Constitutional: She is oriented to person, place, and time. She appears well-developed and well-nourished.  Genitourinary: Vagina normal. There is no rash, tenderness, lesion or injury on the right labia. There is no rash, tenderness, lesion or injury on the left labia.    Lymphadenopathy:       Right: No inguinal adenopathy present.       Left: No inguinal adenopathy present.  Neurological: She is alert and oriented to person, place, and time.  Skin: Skin is warm and dry.  Psychiatric: She has a normal mood and affect.    Speculum placed.  3% acetic acid applied to cervix for >45 seconds.  Cervix visualized with both 7.5X and 15X magnification.  Green filter also used.  Lugols solution was used.  Findings:  Small area of decreased lugol's staining on 12 o'clock location on cervix.  This was completely ectocervical.  Biopsy:  Obtained at this location.  ECC:  was performed.  Monsel's was needed.  Excellent hemostasis was present.  Pt tolerated procedure well and all instruments were removed.  Findings noted above on picture of cervix.  Assessment:  ASCUS with +HR HPV Anxiety related to this new diagnosis H/O either genital warts or HSV  Plan:  Pathology results will be called to patient and follow-up planned pending results.  In additional to procedure, additional  15 minutes spent discussing the questions, concerns, issues related to the +HR HPV test result.

## 2017-02-02 NOTE — Patient Instructions (Signed)

## 2017-02-03 ENCOUNTER — Encounter: Payer: Self-pay | Admitting: Podiatry

## 2017-02-03 ENCOUNTER — Ambulatory Visit (INDEPENDENT_AMBULATORY_CARE_PROVIDER_SITE_OTHER): Payer: Medicare HMO

## 2017-02-03 ENCOUNTER — Ambulatory Visit (INDEPENDENT_AMBULATORY_CARE_PROVIDER_SITE_OTHER): Payer: Self-pay | Admitting: Podiatry

## 2017-02-03 DIAGNOSIS — M21621 Bunionette of right foot: Secondary | ICD-10-CM

## 2017-02-03 DIAGNOSIS — M21622 Bunionette of left foot: Secondary | ICD-10-CM

## 2017-02-03 DIAGNOSIS — M21619 Bunion of unspecified foot: Secondary | ICD-10-CM

## 2017-02-03 DIAGNOSIS — M216X9 Other acquired deformities of unspecified foot: Secondary | ICD-10-CM

## 2017-02-03 NOTE — Progress Notes (Signed)
Subjective:   Patient ID: Denise Bradley, female   DOB: 67 y.o.   MRN: [id number]   HPI Patient states doing well with her left foot and the right foot also is feeling quite a bit better   ROS      Objective:  Physical Exam  Neurovascular status intact negative Homans sign was noted with patient's left foot healing well with wound edges well coapted hallux in rectus position and no plantar corn fifth metatarsal.  The right foot plantarly feels much better than it did previously     Assessment:  Doing well post structural bunion correction left and metatarsal head resection and also right     Plan:  Reviewed condition and at this point reviewed x-ray of the left.  Patient may return to normal shoe gear and I did dispense ankle compression stocking to wear and she will be seen back in 4 weeks or earlier if needed  X-rays indicate osteotomy is healing well with good alignment and satisfactory resection of fifth metatarsal left

## 2017-02-04 NOTE — Telephone Encounter (Signed)
800mg  to 1mg  folic acid daily.  She can take whichever dosage she finds at the pharmacy.  Thanks.

## 2017-02-04 NOTE — Telephone Encounter (Signed)
Vitamin recommendations 02/04/2017 8:08 AM    To: Denise Patience "[name]"    From: Gwendlyn Deutscher, RN    Created: 02/04/2017 8:08 AM     [name],  We received your telephone call regarding your vitamin instructions. Dr.Miller would like you to take Vitamin D3 1000 IU daily, Calcium 600 mg twice daily, and Folic Acid 388 mg to 1 mg daily. Please let us know if you have any additional questions.  Reesa Chew, RN     Encounter closed.

## 2017-02-05 ENCOUNTER — Encounter: Payer: Self-pay | Admitting: Obstetrics & Gynecology

## 2017-02-05 ENCOUNTER — Telehealth: Payer: Self-pay | Admitting: Obstetrics & Gynecology

## 2017-02-05 DIAGNOSIS — R8789 Other abnormal findings in specimens from female genital organs: Secondary | ICD-10-CM | POA: Insufficient documentation

## 2017-02-05 DIAGNOSIS — R87618 Other abnormal cytological findings on specimens from cervix uteri: Secondary | ICD-10-CM | POA: Insufficient documentation

## 2017-02-05 NOTE — Telephone Encounter (Signed)
Please let pt know the biopsy and ECC were negative.  Needs repeat pap and HR HPV 1 year.  Make sure appt is scheduled in one year and if further out, please reschedule.  Agree with recommendations for spotting.  HSV cultures in 2001 was not a very accurate.  Hard for me to make any sort of diagnosis from her reading her medical record.     Please have her sent list of names and I will see if can help her with a person to see.

## 2017-02-05 NOTE — Telephone Encounter (Signed)
Patient called requesting to speak with the nurse about having discharge for the first time today since she had her colposcopy on 02/02/17. She wants to know if this is normal.

## 2017-02-05 NOTE — Telephone Encounter (Signed)
Can you please adjust time for appt to later in the day as I suspect this pt is going to need a lot of time.  CC:  Denise Bradley to assist with time adjustment if needed.

## 2017-02-05 NOTE — Telephone Encounter (Signed)
-----   Message from [place], Generic sent at 02/05/2017 11:40 AM EST -----    I searched through my medical records from Dr. Ree Edman. There was notation on 08/19/99 about Herpes. As I stated in our conversation I struggled communicating with my spouse. The exam revealed what appeared to be extra-genital herpes on buttocks and left side of labia. Herpes culture taken. My next appt. 09/04/99 herpes result was negative. Area in fold of labia major that resembles fungal infection. Prob Staph folliculitis. Was on Zithromax for 5 days.    I am so distraught. I searched my insurance information for psychologists and therapists. [facility] was not listed with my insurance benefits. May I send you the list of therapists listed in network for your opinion who may be best for me?    I felt so comfortable with you and admire your caring and concern for your patients.    Thank you so much  Denise Bradley

## 2017-02-05 NOTE — Telephone Encounter (Signed)
Routing to Dr. Miller

## 2017-02-05 NOTE — Telephone Encounter (Signed)
Spoke with patient, advised as seen below per Dr. Sabra Heck. AEX scheduled for 01/13/18 at 9:15am. 08 recall placed.   Questions about hpv answered, patient still has multple questions and concerns regarding HPV, HSV and colpo results. OV recommended for further discussion with Dr. Sabra Heck. OV scheduled for 02/09/17 at 1pm. Patient is agreeable to time and date.   Length of call 15 min.   Routing to provider for final review. Patient is agreeable to disposition. Will close encounter.

## 2017-02-05 NOTE — Telephone Encounter (Signed)
Spoke with patient. Colpo on 1/8, noticed pink vaginal d/c with odor when wiping this morning. Showered, odor is gone and no other d/c noticed.   Denies any other symptoms.   Advised some spotting is common after colposcopy, continue to monitor. Should pain, fever/chills, heavy bleeding develop return call to office.   Patient asking for colpo results? Advised will forward for Dr. Sabra Heck to review, will return call. Advised Dr. Sabra Heck is seeing patients, response may not be immediate, patient is agreeable.   Dr. Sabra Heck -please review and advise?

## 2017-02-08 NOTE — Telephone Encounter (Signed)
Reviewed schedule with Kandace Blitz, RN.   Spoke with patient, declined OV for today at 4:15pm -[birth year] her [birth year] at [place] -has dentist appt. OV rescheduled to 1/18 at 11:30am with Dr. Sabra Heck. Patient verbalizes understanding and is agreeable.   Routing to provider for final review. Patient is agreeable to disposition. Will close encounter.

## 2017-02-09 ENCOUNTER — Ambulatory Visit: Payer: Self-pay | Admitting: Obstetrics & Gynecology

## 2017-02-09 DIAGNOSIS — R69 Illness, unspecified: Secondary | ICD-10-CM | POA: Diagnosis not present

## 2017-02-12 ENCOUNTER — Encounter: Payer: Self-pay | Admitting: Obstetrics & Gynecology

## 2017-02-12 ENCOUNTER — Ambulatory Visit: Payer: Medicare HMO | Admitting: Obstetrics & Gynecology

## 2017-02-12 VITALS — BP 146/88 | HR 84 | Resp 14

## 2017-02-12 DIAGNOSIS — R69 Illness, unspecified: Secondary | ICD-10-CM | POA: Diagnosis not present

## 2017-02-12 DIAGNOSIS — B977 Papillomavirus as the cause of diseases classified elsewhere: Secondary | ICD-10-CM

## 2017-02-12 NOTE — Progress Notes (Signed)
GYNECOLOGY  VISIT  CC:   Discuss test results  HPI: 67 y.o. G15P3003 Widowed Caucasian female here for discussion of +HR HPV test results obtained with pap smear.  Pt and I have already reviewed all of this with her but she continues to feel "devastated" by this test result.  Has only had intercourse with one person--her spouse who is deceased.  Admits she had a difficult marriage.  He would not communicate openly with her.  He never shared any past history with her.  She wondered at times if he was gay but married at that was "expected" then.  After his death, she found a significant amount of pornography on the computer he used. (STD testing offered and declined today).  Pt reports he never gave her gifts or used kind words with her.  She has good relationship with two daughters but son is "just like him" and they communicate very little.    Discussed with patient nature of HR HPV.  She's never had a test before so could be a chronic carrier of this.  Unfortunately, only time will tell.  Aware of follow up recommendations.  Colposcopy findings reviewed as well.  As I advised pt at last visit, I really do feel she would benefit from therapy.  She has so many emotions that are tied up in this HPV diagnosis that result from her marriage.  This is going to take some time to figure out for her.  I think would be very helpful with her self esteem and with any future relationship she might have.  Pt agrees.  GYNECOLOGIC HISTORY: No LMP recorded. Patient is postmenopausal. Contraception: PMP Menopausal hormone therapy: none  Patient Active Problem List   Diagnosis Date Noted  . Abnormal Papanicolaou smear of cervix with positive human papilloma virus (HPV) test 02/05/2017  . Hyperlipidemia 10/09/2015  . Personal history of colonic polyp-adenoma 04/26/2013  . S/P foot surgery 12/06/2012  . Depression 01/27/1999    Past Medical History:  Diagnosis Date  . Depression   . Hyperlipidemia     Past  Surgical History:  Procedure Laterality Date  . COLONOSCOPY    . FOOT SURGERY  01/15/2012   right  . FOOT SURGERY  July 2014   right  . KNEE ARTHROSCOPY Right    age 13  . LIPOSUCTION  5/05    MEDS:   Current Outpatient Medications on File Prior to Visit  Medication Sig Dispense Refill  . ALPRAZolam (XANAX) 0.5 MG tablet Take 1 tablet (0.5 mg total) by mouth at bedtime as needed for anxiety. 30 tablet 0  . buPROPion (WELLBUTRIN XL) 300 MG 24 hr tablet TAKE 1 TABLET DAILY 90 tablet 2  . Lactobacillus (PROBIOTIC ACIDOPHILUS PO) Take by mouth daily.    Marland Kitchen Specialty Vitamins Products (ONE-A-DAY BONE STRENGTH PO) Take by mouth daily.     No current facility-administered medications on file prior to visit.     ALLERGIES: Acetaminophen and Clindamycin/lincomycin  Family History  Problem Relation Age of Onset  . Diabetes Father   . Hypertension Father   . Heart disease Father        CABG  . Cancer Mother 54       cancer unknown primary  . COPD Mother   . Breast cancer Paternal Aunt   . Colon cancer Neg Hx     SH:  Widowed, non smoker  Review of Systems  Psychiatric/Behavioral: The patient is nervous/anxious.   All other systems reviewed and are negative.  PHYSICAL EXAMINATION:    BP (!) 146/88 (BP Location: Right Arm, Patient Position: Sitting, Cuff Size: Normal)   Pulse 84   Resp 14     General appearance: alert, cooperative and appears stated age, anxious  Assessment: +HR HPV Anxiety regarding this diagnosis Hx of difficult marriage resulting self esteem issues Wary of relationships/men  Plan: D/w pt options for therapy.  She brings list of individuals covered by her insurance.  We discussed this together.  She will call for appt.   ~30 minutes spent with patient >50% of time was in face to face discussion of above.

## 2017-02-15 ENCOUNTER — Encounter: Payer: Self-pay | Admitting: Obstetrics & Gynecology

## 2017-02-15 ENCOUNTER — Telehealth: Payer: Self-pay | Admitting: Obstetrics & Gynecology

## 2017-02-15 NOTE — Telephone Encounter (Signed)
Spoke with patient. Advised the vitamins she is taking are good options and the right vitamins for what Dr.Miller recommended. Patient has concerns about going to a gathering with her friends where there will be wine. Worried if she drinks it will affect her HPV. Advised patient it is okay to have a drink at a social gathering and that this will not affect her HPV. Advised as long as she drinks responsibly and does not over drink this is okay. She is worried about her immune system. Advised a drink every once in a while is okay. Only an issue if this is excessive and impacts her health. Patient verbalizes understanding.  Routing to provider for final review. Patient agreeable to disposition. Will close encounter.

## 2017-02-15 NOTE — Telephone Encounter (Signed)
-----   Message from [place], Generic sent at 02/15/2017 10:48 AM EST -----    Hello    This is Denise Bradley again...Marland KitchenMarland KitchenI was invited to a friends house tonight for wine. I don't want to exclude myself from social gatherings. Can you guide me on the subject of social drinking?     Thanks again

## 2017-02-15 NOTE — Telephone Encounter (Signed)
-----   Message from [place], Generic sent at 02/15/2017 10:41 AM EST -----    Dr. Sabra Heck    I want to be sure I am taking the right vitamins and good brands to help me heal.                               Folic Acid 939 mcg...Marland KitchenMarland KitchenI have [employer] brand but of course there are a number of                                                      companies that make this. What is L-5-MTHF and DIM                                                     What would you recommend?                              Alive Calcium Bone Formula 1,300 mg Plant Source                               Garden of Life B-Complex                              NatureMade D3 (1000IU)    Thank you  [name]

## 2017-02-17 DIAGNOSIS — R69 Illness, unspecified: Secondary | ICD-10-CM | POA: Diagnosis not present

## 2017-02-17 DIAGNOSIS — F411 Generalized anxiety disorder: Secondary | ICD-10-CM | POA: Diagnosis not present

## 2017-02-18 ENCOUNTER — Telehealth: Payer: Self-pay | Admitting: Obstetrics & Gynecology

## 2017-02-18 ENCOUNTER — Encounter: Payer: Self-pay | Admitting: Obstetrics & Gynecology

## 2017-02-18 NOTE — Telephone Encounter (Signed)
Spoke with patient, advised as seen below per Dr. Miller. Patient verbalizes understanding and is agreeable.  Routing to provider for final review. Patient is agreeable to disposition. Will close encounter.  

## 2017-02-18 NOTE — Telephone Encounter (Signed)
Spoke with patient, unable to talk, states she will have to return call to office.

## 2017-02-18 NOTE — Telephone Encounter (Signed)
Spoke with patient.   1. Reports lips are swollen, raw and orange. Started 3 days ago, applying CeraVe and ice with no relief. Denies any use of new products or food. Denies lesions, SHOB, wheezing or itching.  Started new Vitamins 2 weeks ago - folic acid, calcium, b-complex and Vitamin D 3.   Advised may apply small amount of cortisone cream topically, not for long term use. If symptoms don't resolve, f/u with PCP. If new symptoms develop, such as SHOB, wheezing, hives, itching -seek immediate care.    2. Requesting new Rx for Xanax. Has old Rx, has been taking 1/2 0.5 mg tab PRN nightly for anxiety previously prescribed by PCP.   3. Went for first counseling session on 02/17/17.   Advised patient will review with Dr. Sabra Heck and return call with recommendations, patient is agreeable.   Dr. Sabra Heck -please review and advise?

## 2017-02-18 NOTE — Telephone Encounter (Signed)
Patient returned call to Jill. °

## 2017-02-18 NOTE — Telephone Encounter (Signed)
-----   Message from [place], Generic sent at 02/18/2017 9:19 AM EST -----    DR. Miller    My lips have been swollen for 2 days and not getting better with ice pack and CeraVe healing ointment. Can vitamins cause this or HPV itself? Is this just fear, anxiety and stress related? I have taken Xanax as needed for my anxiety in the past. When I looked at my prescription bottle it is outdated from 2017. Are you able to prescribe something for me? I have started counseling and losing weight from so much guilt in my heart.    Tearfully  Denise Bradley

## 2017-02-18 NOTE — Telephone Encounter (Signed)
I would recommend she continue to see her PCP for her Xanax and be seen for her lip issues.  Thanks.

## 2017-02-19 ENCOUNTER — Ambulatory Visit (INDEPENDENT_AMBULATORY_CARE_PROVIDER_SITE_OTHER): Payer: Medicare HMO | Admitting: Family Medicine

## 2017-02-19 ENCOUNTER — Encounter: Payer: Self-pay | Admitting: Family Medicine

## 2017-02-19 ENCOUNTER — Ambulatory Visit: Payer: Self-pay | Admitting: Family Medicine

## 2017-02-19 VITALS — BP 142/80 | HR 78 | Temp 97.7°F | Wt 115.3 lb

## 2017-02-19 DIAGNOSIS — K130 Diseases of lips: Secondary | ICD-10-CM | POA: Diagnosis not present

## 2017-02-19 NOTE — Progress Notes (Signed)
Subjective:    Patient ID: Denise Bradley, female    DOB: [birth year], 67 y.o.   MRN: [id number]  Chief Complaint  Patient presents with  . Acute Visit    HPI Patient was seen today for ongoing concern.  Patient endorses 3 days tried cracked lips.  Patient states she has tried Neosporin and Cerevae but it did not help.  Patient also states she called her OB/GYN who advised her to use hydrocortisone cream on her lips. Pt has a habit of biting her bottom lip.  Patient also becomes tearful at one point during the visit stating she was told she had HPV by OB/GYN.  Patient expresses feeling "dirty" and betrayed.  Patient states her husband died in 2005-05-16.  Patient endorses increasing anxiety because of this.  Patient reassured and educated on HPV.  Past Medical History:  Diagnosis Date  . Depression   . Hyperlipidemia     Allergies  Allergen Reactions  . Acetaminophen Anaphylaxis    REACTION: swells throat shut/can't breathe  . Clindamycin/Lincomycin Other (See Comments)    Cause Loose Stools// Diarrhea    ROS General: Denies fever, chills, night sweats, changes in weight, changes in appetite HEENT: Denies headaches, ear pain, changes in vision, rhinorrhea, sore throat  +lip irritation CV: Denies CP, palpitations, SOB, orthopnea Pulm: Denies SOB, cough, wheezing GI: Denies abdominal pain, nausea, vomiting, diarrhea, constipation GU: Denies dysuria, hematuria, frequency, vaginal discharge Msk: Denies muscle cramps, joint pains Neuro: Denies weakness, numbness, tingling Skin: Denies rashes, bruising Psych: Denies depression, hallucinations  +anxiety     Objective:    Blood pressure (!) 142/80, pulse 78, temperature 97.7 F (36.5 C), temperature source Oral, weight 115 lb 4.8 oz (52.3 kg).   Gen. Pleasant, well-nourished, in no distress, normal affect   HEENT: [place]/AT, face symmetric, no scleral icterus, PERRLA,nares patent without drainage Lungs: no accessory muscle use,  CTAB Cardiovascular: RRR, no peripheral edema Neuro:  A&Ox3, CN II-XII intact, normal gait Skin:  Warm, dry and intact.  Lips appear dry with cracked, peeling skin, no lesions.  No lesions in the mouth.   Wt Readings from Last 3 Encounters:  02/19/17 115 lb 4.8 oz (52.3 kg)  01/06/17 121 lb (54.9 kg)  12/22/16 120 lb (54.4 kg)    Lab Results  Component Value Date   WBC 5.5 10/09/2016   HGB 13.7 10/09/2016   HCT 40.7 10/09/2016   PLT 214 10/09/2016   GLUCOSE 86 10/09/2016   CHOL 235 (H) 10/09/2016   TRIG 67 10/09/2016   HDL 99 10/09/2016   LDLDIRECT 126.6 09/24/2011   LDLCALC 135 (H) 10/03/2015   ALT 26 10/09/2016   AST 27 10/09/2016   NA 139 10/09/2016   K 4.0 10/09/2016   CL 103 10/09/2016   CREATININE 0.79 10/09/2016   BUN 13 10/09/2016   CO2 24 10/09/2016   TSH 2.04 10/09/2016    Assessment/Plan:  Dry lips  -Patient given tube of pure ultra white petroleum jelly -Patient reassured. -Patient advised to stop biting her lip, stop using hydrocortisone, and stop touching her lips.  Pt advised to wash her hands before touching her lips and to increase p.o. intake of water.  Patient given information on counseling in regards to her anxiety and recent HPV diagnosis.  Apparently already in counseling.  Patient requesting Xanax.  Advised to follow-up with her PCP.  Follow-up PRN   Grier Mitts, MD

## 2017-02-23 ENCOUNTER — Encounter: Payer: Self-pay | Admitting: Adult Health

## 2017-02-23 ENCOUNTER — Ambulatory Visit (INDEPENDENT_AMBULATORY_CARE_PROVIDER_SITE_OTHER): Payer: Medicare HMO | Admitting: Adult Health

## 2017-02-23 VITALS — BP 150/80 | Temp 98.3°F | Wt 113.0 lb

## 2017-02-23 DIAGNOSIS — R69 Illness, unspecified: Secondary | ICD-10-CM | POA: Diagnosis not present

## 2017-02-23 DIAGNOSIS — F411 Generalized anxiety disorder: Secondary | ICD-10-CM | POA: Diagnosis not present

## 2017-02-23 DIAGNOSIS — F419 Anxiety disorder, unspecified: Secondary | ICD-10-CM

## 2017-02-23 MED ORDER — ALPRAZOLAM 0.5 MG PO TABS: 0.5000 mg | ORAL_TABLET | Freq: Every evening | ORAL | 0 refills | Status: DC | PRN

## 2017-02-23 MED ORDER — ALPRAZOLAM 0.5 MG PO TABS: 0.5000 mg | ORAL_TABLET | Freq: Every evening | ORAL | 0 refills | Status: AC | PRN

## 2017-02-23 NOTE — Progress Notes (Signed)
Subjective:    Patient ID: Denise Bradley, female    DOB: 04-14-1950, 67 y.o.   MRN: [id number]  Was recently diagnosed with HPV by GYN and has a brother in law who had open heart surgery    Anxiety  Presents for follow-up visit. Symptoms include depressed mood, excessive worry, insomnia and nervous/anxious behavior. Patient reports no confusion, nausea, restlessness, shortness of breath or suicidal ideas. Symptoms occur constantly. The severity of symptoms is moderate. The quality of sleep is good.   Compliance with medications is 76-100%.   Review of Systems  Respiratory: Negative for shortness of breath.   Gastrointestinal: Negative for nausea.  Psychiatric/Behavioral: Negative for confusion and suicidal ideas. The patient is nervous/anxious and has insomnia.    See HPI   Past Medical History:  Diagnosis Date  . Depression   . Hyperlipidemia     Social History   Socioeconomic History  . Marital status: Widowed    Spouse name: Not on file  . Number of children: Not on file  . Years of education: Not on file  . Highest education level: Not on file  Social Needs  . Financial resource strain: Not on file  . Food insecurity - worry: Not on file  . Food insecurity - inability: Not on file  . Transportation needs - medical: Not on file  . Transportation needs - non-medical: Not on file  Occupational History  . Not on file  Tobacco Use  . Smoking status: Never Smoker  . Smokeless tobacco: Never Used  Substance and Sexual Activity  . Alcohol use: Yes    Alcohol/week: 4.2 oz    Types: 7 Standard drinks or equivalent per week  . Drug use: No  . Sexual activity: No    Birth control/protection: Post-menopausal  Other Topics Concern  . Not on file  Social History Narrative   Retired - Worked with [place] and last May she was laid off. She worked there for 22 years   Going back to school for [employer].    No pets   Three children ( [place], [place]. [place] (  Son and two daughters- all married. & grandchildren)   States " Does not do anything fun".          Diet: Eat healthy, does not eat fast food.    Exercise: Does not exercise, does not feel like she has time.        Past Surgical History:  Procedure Laterality Date  . COLONOSCOPY    . FOOT SURGERY  01/15/2012   right  . FOOT SURGERY  July 2014   right  . KNEE ARTHROSCOPY Right    age 26  . LIPOSUCTION  5/05    Family History  Problem Relation Age of Onset  . Diabetes Father   . Hypertension Father   . Heart disease Father        CABG  . Cancer Mother 89       cancer unknown primary  . COPD Mother   . Breast cancer Paternal Aunt   . Colon cancer Neg Hx     Allergies  Allergen Reactions  . Acetaminophen Anaphylaxis    REACTION: swells throat shut/can't breathe  . Clindamycin/Lincomycin Other (See Comments)    Cause Loose Stools// Diarrhea    Current Outpatient Medications on File Prior to Visit  Medication Sig Dispense Refill  . ALPRAZolam (XANAX) 0.5 MG tablet Take 1 tablet (0.5 mg total) by mouth at bedtime as needed for  anxiety. 30 tablet 0  . buPROPion (WELLBUTRIN XL) 300 MG 24 hr tablet TAKE 1 TABLET DAILY 90 tablet 2   No current facility-administered medications on file prior to visit.     Temp 98.3 F (36.8 C) (Oral)   Wt 113 lb (51.3 kg)   BMI 22.82 kg/m       Objective:   Physical Exam  Constitutional: She is oriented to person, place, and time. She appears well-developed and well-nourished. No distress.  Cardiovascular: Normal rate, regular rhythm, normal heart sounds and intact distal pulses. Exam reveals no gallop and no friction rub.  No murmur heard. Pulmonary/Chest: Effort normal and breath sounds normal. No respiratory distress. She has no wheezes. She has no rales. She exhibits no tenderness.  Neurological: She is alert and oriented to person, place, and time.  Skin: Skin is warm and dry. No rash noted. She is not diaphoretic. No  erythema. No pallor.  Psychiatric: She has a normal mood and affect. Her behavior is normal. Judgment and thought content normal.  Nursing note and vitals reviewed.     Assessment & Plan:  1. Anxiety - Ecucated on HPV virus. I advised her not to let this control her life; like it has been. Will refill Xanax she can take for a short time. Last refill was April 2016 - ALPRAZolam (XANAX) 0.5 MG tablet; Take 1 tablet (0.5 mg total) by mouth at bedtime as needed for anxiety.  Dispense: 30 tablet; Refill: 0 - Follow up as needed  Dorothyann Peng, NP

## 2017-02-26 NOTE — Progress Notes (Signed)
DOS 12/22/16 [place] Bunionectomy w/ fixation Lt foot, 5th metatarsal head resection 5th Lt

## 2017-03-03 ENCOUNTER — Ambulatory Visit (INDEPENDENT_AMBULATORY_CARE_PROVIDER_SITE_OTHER): Payer: Medicare HMO

## 2017-03-03 ENCOUNTER — Ambulatory Visit (INDEPENDENT_AMBULATORY_CARE_PROVIDER_SITE_OTHER): Payer: Medicare HMO | Admitting: Podiatry

## 2017-03-03 ENCOUNTER — Encounter: Payer: Self-pay | Admitting: Podiatry

## 2017-03-03 DIAGNOSIS — M21619 Bunion of unspecified foot: Secondary | ICD-10-CM | POA: Diagnosis not present

## 2017-03-03 DIAGNOSIS — R69 Illness, unspecified: Secondary | ICD-10-CM | POA: Diagnosis not present

## 2017-03-03 DIAGNOSIS — F411 Generalized anxiety disorder: Secondary | ICD-10-CM | POA: Diagnosis not present

## 2017-03-03 NOTE — Progress Notes (Signed)
Subjective:   Patient ID: Denise Bradley, female   DOB: 67 y.o.   MRN: [id number]   HPI Patient presents with structural bunion deformity left is doing very well with correction along with fifth metatarsal and states her right foot is also feeling good   ROS      Objective:  Physical Exam  Neurovascular status intact with significant structural bunion deformity left it has been corrected with good alignment mild swelling and fifth metatarsal head resection doing well with surgery right also doing well     Assessment:  Patient is doing well post surgical correction of left foot     Plan:  X-rays reviewed and advised on continued immobilization compression elevation and reappoint to recheck in 4 weeks or earlier if necessary  X-rays indicate that there is good healing of the osteotomy joint congruous with satisfactory resection of bone left

## 2017-03-15 ENCOUNTER — Telehealth: Payer: Self-pay | Admitting: Obstetrics & Gynecology

## 2017-03-15 ENCOUNTER — Encounter: Payer: Self-pay | Admitting: Obstetrics & Gynecology

## 2017-03-15 NOTE — Telephone Encounter (Signed)
See MyChart message to patient, routing to Dr. Sabra Heck, will close encounter.

## 2017-03-15 NOTE — Telephone Encounter (Signed)
-----   Message from [place], Generic sent at 03/15/2017 1:14 PM EST -----    I am unable to find daily amount of calcium of 1200 mg per your instructions. The max amount I can find is 1000mg . Not sure if [employer] will continue to stock Alive 1300mg . Do you have another company or brand that will give me my daily requirement of calcium.    Thank you  Denise Bradley

## 2017-03-15 NOTE — Telephone Encounter (Signed)
Citrical, caltrate, Nature Made all made 600mg  or 400mg  dosing.  These should work.

## 2017-03-15 NOTE — Telephone Encounter (Signed)
Message   ----- Message from [place], Generic sent at 03/15/2017 4:23 PM EST -----    My question is can you suggest a brand that has 1200 mg ? I am only finding 1000mg   ----- Message -----  From: Nurse Farris Has  Sent: 03/15/2017 2:57 PM EST  To: Camie Patience  Subject: Calcium  Ms. Busch,    Good afternoon. Dr. Sabra Heck recommended taking Calcium 600 mg twice a day for at daily total of 1200 mg. Please let us know if you have any additional questions or concerns. Have a great day!    Sincerely,  Glorianne [place], RN

## 2017-03-16 NOTE — Telephone Encounter (Signed)
Spoke with patient, advised as seen below per Dr. Sabra Heck. Patient verbalizes understanding and is agreeable. Will close encounter.

## 2017-03-16 NOTE — Telephone Encounter (Signed)
Left message to call Denise Bradley at [phone].  

## 2017-03-17 ENCOUNTER — Telehealth: Payer: Self-pay | Admitting: Podiatry

## 2017-03-17 NOTE — Telephone Encounter (Signed)
I spoke with pt, shes propped her feet up and is feeling a lot better. Pt. Said shes doing better and she is scheduled for an appt. In March. She decided to just wait for her appt.

## 2017-04-02 ENCOUNTER — Encounter: Payer: Self-pay | Admitting: Adult Health

## 2017-04-06 ENCOUNTER — Ambulatory Visit (INDEPENDENT_AMBULATORY_CARE_PROVIDER_SITE_OTHER): Payer: Medicare HMO | Admitting: Adult Health

## 2017-04-06 ENCOUNTER — Encounter: Payer: Self-pay | Admitting: Adult Health

## 2017-04-06 ENCOUNTER — Ambulatory Visit (INDEPENDENT_AMBULATORY_CARE_PROVIDER_SITE_OTHER)
Admission: RE | Admit: 2017-04-06 | Discharge: 2017-04-06 | Disposition: A | Discharge status: Home / Self Care | Payer: Medicare HMO | Source: Ambulatory Visit | Attending: Adult Health | Admitting: Adult Health

## 2017-04-06 VITALS — BP 150/80 | Temp 97.6°F | Wt 108.0 lb

## 2017-04-06 DIAGNOSIS — R634 Abnormal weight loss: Secondary | ICD-10-CM | POA: Diagnosis not present

## 2017-04-06 DIAGNOSIS — R195 Other fecal abnormalities: Secondary | ICD-10-CM | POA: Diagnosis not present

## 2017-04-06 NOTE — Progress Notes (Signed)
Subjective:    Patient ID: Denise Bradley, female    DOB: 05-07-1950, 67 y.o.   MRN: [id number]  HPI  67 year old female who  has a past medical history of Depression and Hyperlipidemia.  She presents to the office today with the complaint if " grainy stools" since being diagnosed with C.diff in January 2018. She denies any blood in her stool. Reports only having a bowel movements in the morning and will sometimes have three BM's within the morning hours. She denies any diarrhea, abdominal pain or cramping, or abnormal smell.   She is also concerned about unintnetional weight loss. She has been eating fruit, vegetables, and lean protein since January. She has also given up alcohol and snacking. She is not exercising on a routine basis but plans to start walking and weight training since the weather is getting warmer.   She denies feeling acutely ill.   Wt Readings from Last 3 Encounters:  04/06/17 108 lb (49 kg)  02/23/17 113 lb (51.3 kg)  02/19/17 115 lb 4.8 oz (52.3 kg)    Review of Systems See HPI   Past Medical History:  Diagnosis Date  . Depression   . Hyperlipidemia     Social History   Socioeconomic History  . Marital status: Widowed    Spouse name: Not on file  . Number of children: Not on file  . Years of education: Not on file  . Highest education level: Not on file  Social Needs  . Financial resource strain: Not on file  . Food insecurity - worry: Not on file  . Food insecurity - inability: Not on file  . Transportation needs - medical: Not on file  . Transportation needs - non-medical: Not on file  Occupational History  . Not on file  Tobacco Use  . Smoking status: Never Smoker  . Smokeless tobacco: Never Used  Substance and Sexual Activity  . Alcohol use: Yes    Alcohol/week: 4.2 oz    Types: 7 Standard drinks or equivalent per week  . Drug use: No  . Sexual activity: No    Birth control/protection: Post-menopausal  Other Topics Concern  .  Not on file  Social History Narrative   Retired - Worked with [employer] and last May she was laid off. She worked there for 22 years   Going back to school for [employer].    No pets   Three children ( [place], [place]. [place] ( Son and two daughters- all married. & grandchildren)   States " Does not do anything fun".          Diet: Eat healthy, does not eat fast food.    Exercise: Does not exercise, does not feel like she has time.        Past Surgical History:  Procedure Laterality Date  . COLONOSCOPY    . FOOT SURGERY  01/15/2012   right  . FOOT SURGERY  July 2014   right  . KNEE ARTHROSCOPY Right    age 70  . LIPOSUCTION  5/05    Family History  Problem Relation Age of Onset  . Diabetes Father   . Hypertension Father   . Heart disease Father        CABG  . Cancer Mother 24       cancer unknown primary  . COPD Mother   . Breast cancer Paternal Aunt   . Colon cancer Neg Hx     Allergies  Allergen Reactions  .  Acetaminophen Anaphylaxis    REACTION: swells throat shut/can't breathe  . Clindamycin/Lincomycin Other (See Comments)    Cause Loose Stools// Diarrhea    Current Outpatient Medications on File Prior to Visit  Medication Sig Dispense Refill  . b complex vitamins tablet Take 1 tablet by mouth daily.    Marland Kitchen buPROPion (WELLBUTRIN XL) 300 MG 24 hr tablet TAKE 1 TABLET DAILY 90 tablet 2  . CALCIUM PO Take 1,200 mg by mouth daily.    . Cholecalciferol (D3-1000 PO) Take 1 tablet by mouth daily.    Marland Kitchen FOLIC ACID PO Take by mouth.    . Probiotic Product (PROBIOTIC PO) Take by mouth. Women's Probiotic - [place] [employer] [place]     No current facility-administered medications on file prior to visit.     BP (!) 150/80   Temp 97.6 F (36.4 C) (Oral)   Wt 108 lb (49 kg)   BMI 21.81 kg/m       Objective:   Physical Exam  Constitutional: She is oriented to person, place, and time. She appears well-developed and well-nourished. No distress.  Cardiovascular: Normal  rate, regular rhythm, normal heart sounds and intact distal pulses. Exam reveals no gallop and no friction rub.  No murmur heard. Pulmonary/Chest: Effort normal and breath sounds normal. No respiratory distress. She has no wheezes. She has no rales. She exhibits no tenderness.  Abdominal: Soft. Bowel sounds are normal. She exhibits no distension and no mass. There is no tenderness. There is no rebound and no guarding.  Neurological: She is alert and oriented to person, place, and time.  Skin: Skin is warm and dry. No rash noted. She is not diaphoretic. No erythema. No pallor.  Psychiatric: She has a normal mood and affect. Her behavior is normal. Judgment and thought content normal.  Nursing note and vitals reviewed.     Assessment & Plan:  Symptoms likely diet related. Will get chest x ray and then have her follow up in 1 month to check on weight. Consider CT of abdomen and/or GI consult at that time   Dorothyann Peng, NP

## 2017-04-14 ENCOUNTER — Ambulatory Visit (INDEPENDENT_AMBULATORY_CARE_PROVIDER_SITE_OTHER): Payer: Medicare HMO

## 2017-04-14 ENCOUNTER — Encounter: Payer: Self-pay | Admitting: Podiatry

## 2017-04-14 ENCOUNTER — Ambulatory Visit (INDEPENDENT_AMBULATORY_CARE_PROVIDER_SITE_OTHER): Payer: Medicare HMO | Admitting: Podiatry

## 2017-04-14 DIAGNOSIS — M779 Enthesopathy, unspecified: Secondary | ICD-10-CM

## 2017-04-14 DIAGNOSIS — R209 Unspecified disturbances of skin sensation: Secondary | ICD-10-CM

## 2017-04-14 DIAGNOSIS — M2012 Hallux valgus (acquired), left foot: Secondary | ICD-10-CM | POA: Diagnosis not present

## 2017-04-14 NOTE — Progress Notes (Signed)
Subjective:   Patient ID: Denise Bradley, female   DOB: 67 y.o.   MRN: [id number]   HPI Patient presents stating overall doing okay but still feels like she walks on bone on her right foot.  No longer has the sharp pain she used to but it is still something that she is aggravated by.  Patient also has discoloration and duskiness of her lesser digits right over left and probably had cold exposure several weeks ago   ROS      Objective:  Physical Exam  Neurovascular status intact with patient's left foot healing very well post surgery in the right foot showing continued pressure against the first metatarsal but no longer near the same type of pain she had experienced previously.  Patient is noted to have mild duskiness of the lesser digits right over left with no breakdown of tissue     Assessment:  Overall doing well with good correction of the left foot and right foot showing plantarflexion of the first metatarsal.  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  Also will begin soaks to try to reduce the duskiness of her feet and will need to wear thick socks in order to protect them  X-ray indicates that there is good healing of the osteotomy with satisfactory position of the metatarsal bilateral

## 2017-04-19 ENCOUNTER — Telehealth: Payer: Self-pay | Admitting: Family Medicine

## 2017-04-19 NOTE — Telephone Encounter (Signed)
Copied from [place] [id number]. Topic: General - Other >> Apr 19, 2017  9:20 AM Oneta Rack wrote: Relation to pt: self Call back number: [phone] Pharmacy: [facility]   Reason for call:  Patient called to inform PCP mail order pharmacy has changed to [facility] phone # [phone] fax [phone], patient requesting buPROPion (WELLBUTRIN XL) 300 MG 24 hr tablet refill, please advise >> Apr 19, 2017  9:23 AM Oneta Rack wrote: Relation to pt: self Call back number: [phone] Pharmacy: [facility]   Reason for call:  Patient called to inform PCP mail order pharmacy has changed to [facility] phone # [phone] fax [phone], patient requesting buPROPion (WELLBUTRIN XL) 300 MG 24 hr tablet refill, please advise

## 2017-04-20 MED ORDER — BUPROPION HCL ER (XL) 300 MG PO TB24: 300.0000 mg | ORAL_TABLET | Freq: Every day | ORAL | 1 refills | Status: DC

## 2017-04-20 NOTE — Telephone Encounter (Signed)
Rx has been sent to the pharmacy

## 2017-04-22 ENCOUNTER — Telehealth: Payer: Self-pay | Admitting: Family Medicine

## 2017-04-22 ENCOUNTER — Other Ambulatory Visit: Payer: Self-pay | Admitting: Adult Health

## 2017-04-22 ENCOUNTER — Ambulatory Visit: Payer: Medicare HMO | Admitting: Orthotics

## 2017-04-22 ENCOUNTER — Other Ambulatory Visit: Payer: Self-pay | Admitting: *Deleted

## 2017-04-22 DIAGNOSIS — M2012 Hallux valgus (acquired), left foot: Secondary | ICD-10-CM

## 2017-04-22 DIAGNOSIS — M779 Enthesopathy, unspecified: Secondary | ICD-10-CM

## 2017-04-22 DIAGNOSIS — M216X9 Other acquired deformities of unspecified foot: Secondary | ICD-10-CM

## 2017-04-22 MED ORDER — BUPROPION HCL ER (XL) 300 MG PO TB24: 300.0000 mg | ORAL_TABLET | Freq: Every day | ORAL | 1 refills | Status: DC

## 2017-04-22 NOTE — Telephone Encounter (Signed)
Ok to send to [employer] and cancel other

## 2017-04-22 NOTE — Telephone Encounter (Signed)
Copied from [place] [id number]. Topic: Inquiry >> Apr 22, 2017 11:22 AM Pricilla Handler wrote: Reason for CRM: Patient wants her refill of BuPROPion (WELLBUTRIN XL) 300 MG 24 hr tablet to be sent to her preferred local pharmacy: [facility] # [address], [place] [phone] (Phone)  [phone] (Fax). Patient wants the original Bupropion that was to be sent to her [place] cancelled. Please call patient once this refill has been sent per patient.       Thank You!!!

## 2017-04-22 NOTE — Telephone Encounter (Signed)
Called and spoke to Altheimer at [place].  Wellbutrin has been discontinued.  No further action needed.

## 2017-04-22 NOTE — Progress Notes (Signed)
Patient decided against another f/o at this time; she wants just some ppt to go under her prominent 1st met head RT; advised her against this and that best results would come from f/o.  She insisted she wants to try this as she wants to wear sandles.

## 2017-04-22 NOTE — Telephone Encounter (Signed)
Copied from [place] [id number]. Topic: Inquiry >> Apr 22, 2017 11:22 AM Denise Bradley wrote: Reason for CRM: Patient wants her refill of BuPROPion (WELLBUTRIN XL) 300 MG 24 hr tablet to be sent to her preferred local pharmacy: [facility] # [address] [place] [phone] (Phone)  [phone] (Fax). Patient wants the original Bupropion that was to be sent to her [place] cancelled. Please call patient once this refill has been sent per patient.       Thank You!!!

## 2017-04-22 NOTE — Telephone Encounter (Signed)
Attempted to contact pt regarding prescription; prescription sent to [facility]; spoke with Sunday Spillers at [facility]

## 2017-04-22 NOTE — Telephone Encounter (Signed)
Rx sent to local pharmacy per patient request- patient notified. Called [employer] to make sure Rx cancelled.

## 2017-04-27 ENCOUNTER — Ambulatory Visit: Payer: Medicare HMO | Admitting: Orthotics

## 2017-04-27 DIAGNOSIS — M778 Other enthesopathies, not elsewhere classified: Secondary | ICD-10-CM

## 2017-04-27 DIAGNOSIS — M2012 Hallux valgus (acquired), left foot: Secondary | ICD-10-CM

## 2017-04-27 DIAGNOSIS — M779 Enthesopathy, unspecified: Secondary | ICD-10-CM

## 2017-04-27 DIAGNOSIS — M21622 Bunionette of left foot: Secondary | ICD-10-CM

## 2017-04-27 DIAGNOSIS — M21621 Bunionette of right foot: Secondary | ICD-10-CM

## 2017-04-27 NOTE — Progress Notes (Signed)
Patient decided NOT to get f/o due to financial considerations; I told her to return to get current f/o modified to accomodate prominent first head.

## 2017-05-07 ENCOUNTER — Encounter: Payer: Self-pay | Admitting: Adult Health

## 2017-05-07 ENCOUNTER — Ambulatory Visit (INDEPENDENT_AMBULATORY_CARE_PROVIDER_SITE_OTHER): Payer: Medicare HMO | Admitting: Adult Health

## 2017-05-07 VITALS — BP 142/80 | Temp 98.1°F | Wt 109.0 lb

## 2017-05-07 DIAGNOSIS — R634 Abnormal weight loss: Secondary | ICD-10-CM

## 2017-05-07 NOTE — Progress Notes (Signed)
Subjective:    Patient ID: Denise Bradley, female    DOB: 1950/10/13, 67 y.o.   MRN: [id number]  HPI  67 year old female who  has a past medical history of Depression and Hyperlipidemia. She presents to the office today for follow up regarding weight loss. When I last saw her a month ago she was concern for weight loss. She had stopped drinking, was eating healthy and had started exercising. She also reported grainy stools. Denied any blood in stool or abdominal pain   Chest xray was normal.   Today in the office she reports that she is feeling good. Feels as though her stools have changed and are less grainy.   Wt Readings from Last 3 Encounters:  05/07/17 109 lb (49.4 kg)  04/06/17 108 lb (49 kg)  02/23/17 113 lb (51.3 kg)    Review of Systems See HPI   Past Medical History:  Diagnosis Date  . Depression   . Hyperlipidemia     Social History   Socioeconomic History  . Marital status: Widowed    Spouse name: Not on file  . Number of children: Not on file  . Years of education: Not on file  . Highest education level: Not on file  Occupational History  . Not on file  Social Needs  . Financial resource strain: Not on file  . Food insecurity:    Worry: Not on file    Inability: Not on file  . Transportation needs:    Medical: Not on file    Non-medical: Not on file  Tobacco Use  . Smoking status: Never Smoker  . Smokeless tobacco: Never Used  Substance and Sexual Activity  . Alcohol use: Yes    Alcohol/week: 4.2 oz    Types: 7 Standard drinks or equivalent per week  . Drug use: No  . Sexual activity: Never    Birth control/protection: Post-menopausal  Lifestyle  . Physical activity:    Days per week: Not on file    Minutes per session: Not on file  . Stress: Not on file  Relationships  . Social connections:    Talks on phone: Not on file    Gets together: Not on file    Attends religious service: Not on file    Active member of club or  organization: Not on file    Attends meetings of clubs or organizations: Not on file    Relationship status: Not on file  . Intimate partner violence:    Fear of current or ex partner: Not on file    Emotionally abused: Not on file    Physically abused: Not on file    Forced sexual activity: Not on file  Other Topics Concern  . Not on file  Social History Narrative   Retired - Worked with [employer] and last May she was laid off. She worked there for 22 years   Going back to school for [employer].    No pets   Three children ( [place], [place]. [place] ( Son and two daughters- all married. & grandchildren)   States " Does not do anything fun".          Diet: Eat healthy, does not eat fast food.    Exercise: Does not exercise, does not feel like she has time.        Past Surgical History:  Procedure Laterality Date  . COLONOSCOPY    . FOOT SURGERY  01/15/2012   right  .  FOOT SURGERY  July 2014   right  . KNEE ARTHROSCOPY Right    age 70  . LIPOSUCTION  5/05    Family History  Problem Relation Age of Onset  . Diabetes Father   . Hypertension Father   . Heart disease Father        CABG  . Cancer Mother 12       cancer unknown primary  . COPD Mother   . Breast cancer Paternal Aunt   . Colon cancer Neg Hx     Allergies  Allergen Reactions  . Acetaminophen Anaphylaxis    REACTION: swells throat shut/can't breathe  . Clindamycin/Lincomycin Other (See Comments)    Cause Loose Stools// Diarrhea    Current Outpatient Medications on File Prior to Visit  Medication Sig Dispense Refill  . b complex vitamins tablet Take 1 tablet by mouth daily.    Marland Kitchen buPROPion (WELLBUTRIN XL) 300 MG 24 hr tablet Take 1 tablet (300 mg total) by mouth daily. 90 tablet 1  . CALCIUM PO Take 1,200 mg by mouth daily.    . Cholecalciferol (D3-1000 PO) Take 1 tablet by mouth daily.    Marland Kitchen FOLIC ACID PO Take by mouth.    . Probiotic Product (PROBIOTIC PO) Take by mouth. Women's Probiotic -  [place]     No current facility-administered medications on file prior to visit.     BP (!) 142/80   Temp 98.1 F (36.7 C) (Oral)   Wt 109 lb (49.4 kg)   BMI 22.02 kg/m       Objective:   Physical Exam  Constitutional: She is oriented to person, place, and time. She appears well-developed and well-nourished. No distress.  Cardiovascular: Normal rate, regular rhythm, normal heart sounds and intact distal pulses. Exam reveals no gallop and no friction rub.  No murmur heard. Pulmonary/Chest: Effort normal and breath sounds normal. No respiratory distress. She has no wheezes. She has no rales. She exhibits no tenderness.  Neurological: She is alert and oriented to person, place, and time.  Skin: Skin is warm and dry. No rash noted. She is not diaphoretic. No erythema. No pallor.  Psychiatric: She has a normal mood and affect. Her behavior is normal. Judgment and thought content normal.  Vitals reviewed.     Assessment & Plan:  1. Unintentional weight loss - Weight up about one pound over the last month  - Reassured patient  - Continue with current lifestyle  - Follow up in Sept for CPE or sooner if needed  Dorothyann Peng, NP

## 2017-05-10 ENCOUNTER — Ambulatory Visit: Payer: Medicare HMO | Admitting: Orthotics

## 2017-05-10 DIAGNOSIS — M21619 Bunion of unspecified foot: Secondary | ICD-10-CM

## 2017-05-10 DIAGNOSIS — M2012 Hallux valgus (acquired), left foot: Secondary | ICD-10-CM

## 2017-05-10 DIAGNOSIS — M216X9 Other acquired deformities of unspecified foot: Secondary | ICD-10-CM

## 2017-05-10 NOTE — Progress Notes (Signed)
Patient didn't want to purchase new f/o, so I adjusted her current ones to add kinetic wedge under first met head as well as a spot of ppt cushioning.

## 2017-06-07 ENCOUNTER — Ambulatory Visit: Payer: Medicare HMO | Admitting: Orthotics

## 2017-06-07 DIAGNOSIS — M21619 Bunion of unspecified foot: Secondary | ICD-10-CM

## 2017-06-07 NOTE — Progress Notes (Signed)
Patient wanted me to put PPT under first met head in sandals. I did I also added ppt on f/o and added lateral wedge to see if she like that before ordering her a RT foot only f/o.

## 2017-08-23 ENCOUNTER — Other Ambulatory Visit: Payer: Self-pay | Admitting: Physical Medicine and Rehabilitation

## 2017-08-23 DIAGNOSIS — Z1231 Encounter for screening mammogram for malignant neoplasm of breast: Secondary | ICD-10-CM

## 2017-09-07 DIAGNOSIS — R69 Illness, unspecified: Secondary | ICD-10-CM | POA: Diagnosis not present

## 2017-09-09 DIAGNOSIS — H524 Presbyopia: Secondary | ICD-10-CM | POA: Diagnosis not present

## 2017-09-09 DIAGNOSIS — H02831 Dermatochalasis of right upper eyelid: Secondary | ICD-10-CM | POA: Diagnosis not present

## 2017-09-09 DIAGNOSIS — H2513 Age-related nuclear cataract, bilateral: Secondary | ICD-10-CM | POA: Diagnosis not present

## 2017-09-09 DIAGNOSIS — H02834 Dermatochalasis of left upper eyelid: Secondary | ICD-10-CM | POA: Diagnosis not present

## 2017-10-07 ENCOUNTER — Ambulatory Visit
Admission: RE | Admit: 2017-10-07 | Discharge: 2017-10-07 | Disposition: A | Discharge status: Home / Self Care | Payer: Medicare HMO | Source: Ambulatory Visit | Attending: Physical Medicine and Rehabilitation | Admitting: Physical Medicine and Rehabilitation

## 2017-10-07 DIAGNOSIS — Z1231 Encounter for screening mammogram for malignant neoplasm of breast: Secondary | ICD-10-CM | POA: Diagnosis not present

## 2017-11-22 ENCOUNTER — Encounter: Payer: Self-pay | Admitting: Family Medicine

## 2017-11-22 ENCOUNTER — Ambulatory Visit (INDEPENDENT_AMBULATORY_CARE_PROVIDER_SITE_OTHER): Payer: Medicare HMO | Admitting: Family Medicine

## 2017-11-22 VITALS — BP 158/80 | HR 84 | Temp 98.2°F | Wt 106.6 lb

## 2017-11-22 DIAGNOSIS — J309 Allergic rhinitis, unspecified: Secondary | ICD-10-CM | POA: Diagnosis not present

## 2017-11-22 DIAGNOSIS — I10 Essential (primary) hypertension: Secondary | ICD-10-CM

## 2017-11-22 DIAGNOSIS — J329 Chronic sinusitis, unspecified: Secondary | ICD-10-CM | POA: Diagnosis not present

## 2017-11-22 MED ORDER — FLUTICASONE PROPIONATE 50 MCG/ACT NA SUSP: 2.0000 | Freq: Every day | NASAL | 6 refills | Status: DC

## 2017-11-22 MED ORDER — DOXYCYCLINE HYCLATE 100 MG PO TABS: 100.0000 mg | ORAL_TABLET | Freq: Two times a day (BID) | ORAL | 0 refills | Status: AC

## 2017-11-22 NOTE — Patient Instructions (Addendum)
Consider trial of flonase and anti-histamine to help with chronic sinus drainage.   Check blood pressures at home. Bring cuff and recorded blood pressures to visit with Tommi Rumps next month.

## 2017-11-22 NOTE — Progress Notes (Signed)
Denise Bradley DOB: 11-Jun-1950 Encounter date: 11/22/2017  This is a 67 y.o. female who presents with Chief Complaint  Patient presents with  .  chest congestion    x 10 days,coughing up  thick yellow mucus, unable to sleep pt states she feels like shes choking, used advil cold and sinus, claritin D    History of present illness:  Always has some throat clearing.   Now heavy, thick mucous.   No fevers.   Does feel SOB/wheezy. Settled in chest.     Allergies  Allergen Reactions  . Acetaminophen Anaphylaxis    REACTION: swells throat shut/can't breathe  . Clindamycin/Lincomycin Other (See Comments)    Cause Loose Stools// Diarrhea   Current Meds  Medication Sig  . b complex vitamins tablet Take 1 tablet by mouth daily.  Marland Kitchen buPROPion (WELLBUTRIN XL) 300 MG 24 hr tablet Take 1 tablet (300 mg total) by mouth daily.  Marland Kitchen CALCIUM PO Take 1,200 mg by mouth daily.  . Cholecalciferol (D3-1000 PO) Take 1 tablet by mouth daily.  Marland Kitchen FOLIC ACID PO Take by mouth.  . Probiotic Product (PROBIOTIC PO) Take by mouth. Women's Probiotic - [place]    Review of Systems  Constitutional: Negative for chills and fever.  HENT: Positive for congestion and sinus pressure. Negative for ear pain and sinus pain.   Respiratory: Positive for cough and wheezing. Negative for shortness of breath. Choking: feels like she is choking on phlegm.     Objective:  BP (!) 158/80   Pulse 84   Temp 98.2 F (36.8 C) (Oral)   Wt 106 lb 9.6 oz (48.4 kg)   SpO2 96%   BMI 21.53 kg/m   Weight: 106 lb 9.6 oz (48.4 kg)   BP Readings from Last 3 Encounters:  11/22/17 (!) 158/80  05/07/17 (!) 142/80  04/06/17 (!) 150/80   Wt Readings from Last 3 Encounters:  11/22/17 106 lb 9.6 oz (48.4 kg)  05/07/17 109 lb (49.4 kg)  04/06/17 108 lb (49 kg)    Physical Exam  Constitutional: She appears well-developed and well-nourished. No distress.  HENT:  Head: Normocephalic and atraumatic.  Right Ear:  Tympanic membrane, external ear and ear canal normal.  Left Ear: Tympanic membrane, external ear and ear canal normal.  Nose: Mucosal edema present. Right sinus exhibits no maxillary sinus tenderness and no frontal sinus tenderness. Left sinus exhibits no maxillary sinus tenderness and no frontal sinus tenderness.  Mouth/Throat: Uvula is midline and mucous membranes are normal. No oropharyngeal exudate or posterior oropharyngeal erythema.  Cardiovascular: Normal rate and regular rhythm.  Pulmonary/Chest: Effort normal and breath sounds normal. No respiratory distress. She has no wheezes. She has no rhonchi. She has no rales.    Assessment/Plan  1. Sinusitis, unspecified chronicity, unspecified location Let us know if any worsening of sx or if not improved after treatment. - doxycycline (VIBRA-TABS) 100 MG tablet; Take 1 tablet (100 mg total) by mouth 2 (two) times daily for 7 days.  Dispense: 14 tablet; Refill: 0  2. Allergic rhinitis, unspecified seasonality, unspecified trigger Flonase, anti-histamine.  - fluticasone (FLONASE) 50 MCG/ACT nasal spray; Place 2 sprays into both nostrils daily.  Dispense: 16 g; Refill: 6  3. Hypertension, unspecified type Check blood pressures at home. She does not want to start medication, but we discussed risks of elevated bp including heart attack, stroke. We discussed benefits of stress/anxiety control. I encouraged her to track bp at home (call if running 150+ at home and  I am happy to start treatment prior to appt with [facility]) and monitor factors that affect her bp.     Return for blood pressure recheck.     Micheline Rough, MD

## 2017-12-13 ENCOUNTER — Encounter: Payer: Self-pay | Admitting: Adult Health

## 2017-12-13 ENCOUNTER — Ambulatory Visit (INDEPENDENT_AMBULATORY_CARE_PROVIDER_SITE_OTHER): Payer: Medicare HMO | Admitting: Adult Health

## 2017-12-13 VITALS — BP 160/90 | Temp 97.7°F | Ht 59.5 in | Wt 105.0 lb

## 2017-12-13 DIAGNOSIS — Z23 Encounter for immunization: Secondary | ICD-10-CM | POA: Diagnosis not present

## 2017-12-13 DIAGNOSIS — F32A Depression, unspecified: Secondary | ICD-10-CM

## 2017-12-13 DIAGNOSIS — Z0000 Encounter for general adult medical examination without abnormal findings: Secondary | ICD-10-CM

## 2017-12-13 DIAGNOSIS — R69 Illness, unspecified: Secondary | ICD-10-CM | POA: Diagnosis not present

## 2017-12-13 DIAGNOSIS — F329 Major depressive disorder, single episode, unspecified: Secondary | ICD-10-CM

## 2017-12-13 DIAGNOSIS — E785 Hyperlipidemia, unspecified: Secondary | ICD-10-CM

## 2017-12-13 DIAGNOSIS — R030 Elevated blood-pressure reading, without diagnosis of hypertension: Secondary | ICD-10-CM

## 2017-12-13 DIAGNOSIS — Z1159 Encounter for screening for other viral diseases: Secondary | ICD-10-CM | POA: Diagnosis not present

## 2017-12-13 LAB — COMPREHENSIVE METABOLIC PANEL
ALBUMIN: 4.7 g/dL (ref 3.5–5.2)
ALK PHOS: 79 U/L (ref 39–117)
ALT: 27 U/L (ref 0–35)
AST: 26 U/L (ref 0–37)
BUN: 16 mg/dL (ref 6–23)
CALCIUM: 9.9 mg/dL (ref 8.4–10.5)
CHLORIDE: 102 meq/L (ref 96–112)
CO2: 30 mEq/L (ref 19–32)
Creatinine, Ser: 0.94 mg/dL (ref 0.40–1.20)
GFR: 63.35 mL/min (ref >60.00)
Glucose, Bld: 84 mg/dL (ref 70–99)
POTASSIUM: 4.2 meq/L (ref 3.5–5.1)
SODIUM: 141 meq/L (ref 135–145)
TOTAL PROTEIN: 7.2 g/dL (ref 6.0–8.3)
Total Bilirubin: 0.6 mg/dL (ref 0.2–1.2)

## 2017-12-13 LAB — CBC WITH DIFFERENTIAL/PLATELET
BASOS PCT: 0.7 % (ref 0.0–3.0)
Basophils Absolute: 0 10*3/uL (ref 0.0–0.1)
Eosinophils Absolute: 0.1 10*3/uL (ref 0.0–0.7)
Eosinophils Relative: 2.2 % (ref 0.0–5.0)
HEMATOCRIT: 42.1 % (ref 36.0–46.0)
HEMOGLOBIN: 13.6 g/dL (ref 12.0–15.0)
Lymphocytes Relative: 36 % (ref 12.0–46.0)
Lymphs Abs: 1.9 10*3/uL (ref 0.7–4.0)
MCHC: 32.4 g/dL (ref 30.0–36.0)
MCV: 89.1 fl (ref 78.0–100.0)
MONO ABS: 0.5 10*3/uL (ref 0.1–1.0)
MONOS PCT: 8.8 % (ref 3.0–12.0)
Neutro Abs: 2.7 10*3/uL (ref 1.4–7.7)
Neutrophils Relative %: 52.3 % (ref 43.0–77.0)
Platelets: 220 10*3/uL (ref 150.0–400.0)
RBC: 4.72 Mil/uL (ref 3.87–5.11)
RDW: 13.6 % (ref 11.5–15.5)
WBC: 5.2 10*3/uL (ref 4.0–10.5)

## 2017-12-13 LAB — LIPID PANEL
CHOL/HDL RATIO: 2
CHOLESTEROL: 238 mg/dL — AB (ref 0–200)
HDL: 96.8 mg/dL (ref >39.00)
LDL Cholesterol: 125 mg/dL — ABNORMAL HIGH (ref 0–99)
NONHDL: 140.93
Triglycerides: 81 mg/dL (ref 0.0–149.0)
VLDL: 16.2 mg/dL (ref 0.0–40.0)

## 2017-12-13 LAB — TSH: TSH: 2.57 u[IU]/mL (ref 0.35–4.50)

## 2017-12-13 MED ORDER — ZOSTER VAC RECOMB ADJUVANTED 50 MCG/0.5ML IM SUSR: 0.5000 mL | Freq: Once | INTRAMUSCULAR | 1 refills | Status: AC

## 2017-12-13 NOTE — Addendum Note (Signed)
Addended by: Miles Costain T on: 12/13/2017 09:45 AM   Modules accepted: Orders

## 2017-12-13 NOTE — Patient Instructions (Signed)
It was great seeing you today   Please get an Omron blood pressure cuff and monitor your blood pressure twice a day. Follow up after the holidays with your log.   I hope you have a great holiday season and Happy Birthday!

## 2017-12-13 NOTE — Progress Notes (Signed)
Subjective:    Patient ID: Denise Bradley, female    DOB: 1950-07-27, 67 y.o.   MRN: [id number]  HPI  Patient presents for yearly preventative medicine examination. She is a pleasant 67 year old female who  has a past medical history of Depression and Hyperlipidemia.  Depression - Well controlled with Wellbutrin  300 mg   Hyperlipidemia - Not currently prescribed statin   Elevated Blood pressure readings  - She does not monitor at home. Her BP is elevated in the office but she feels anxious every time she comes in   All immunizations and health maintenance protocols were reviewed with the patient and needed orders were placed. She is due for flu vaccination and Prevnar 13.   Appropriate screening laboratory values were ordered for the patient including screening of hyperlipidemia, renal function and hepatic function.  Medication reconciliation,  past medical history, social history, problem list and allergies were reviewed in detail with the patient  Goals were established with regard to weight loss, exercise, and  diet in compliance with medications. She continues to eat well, is very active and does not drink alcohol as often.  Wt Readings from Last 3 Encounters:  12/13/17 105 lb (47.6 kg)  11/22/17 106 lb 9.6 oz (48.4 kg)  05/07/17 109 lb (49.4 kg)   End of life planning was discussed.  She is up to date on routine screening colonoscopy, mammogram, and pap.    Review of Systems  Constitutional: Negative.   HENT: Negative.   Eyes: Negative.   Respiratory: Negative.   Cardiovascular: Negative.   Gastrointestinal: Negative.   Endocrine: Negative.   Genitourinary: Negative.   Musculoskeletal: Negative.   Skin: Negative.   Allergic/Immunologic: Negative.   Neurological: Negative.   Hematological: Negative.   Psychiatric/Behavioral: Negative.    Past Medical History:  Diagnosis Date  . Depression   . Hyperlipidemia     Social History   Socioeconomic History   . Marital status: Widowed    Spouse name: Not on file  . Number of children: Not on file  . Years of education: Not on file  . Highest education level: Not on file  Occupational History  . Not on file  Social Needs  . Financial resource strain: Not on file  . Food insecurity:    Worry: Not on file    Inability: Not on file  . Transportation needs:    Medical: Not on file    Non-medical: Not on file  Tobacco Use  . Smoking status: Never Smoker  . Smokeless tobacco: Never Used  Substance and Sexual Activity  . Alcohol use: Yes    Alcohol/week: 7.0 standard drinks    Types: 7 Standard drinks or equivalent per week  . Drug use: No  . Sexual activity: Never    Birth control/protection: Post-menopausal  Lifestyle  . Physical activity:    Days per week: Not on file    Minutes per session: Not on file  . Stress: Not on file  Relationships  . Social connections:    Talks on phone: Not on file    Gets together: Not on file    Attends religious service: Not on file    Active member of club or organization: Not on file    Attends meetings of clubs or organizations: Not on file    Relationship status: Not on file  . Intimate partner violence:    Fear of current or ex partner: Not on file    Emotionally  abused: Not on file    Physically abused: Not on file    Forced sexual activity: Not on file  Other Topics Concern  . Not on file  Social History Narrative   Retired - Worked with [employer] and last May she was laid off. She worked there for 22 years   Going back to school for [employer].    No pets   Three children ( [place], [place]. [place] ( Son and two daughters- all married. & grandchildren)   States " Does not do anything fun".          Diet: Eat healthy, does not eat fast food.    Exercise: Does not exercise, does not feel like she has time.        Past Surgical History:  Procedure Laterality Date  . COLONOSCOPY    . FOOT SURGERY  01/15/2012   right  .  FOOT SURGERY  July 2014   right  . KNEE ARTHROSCOPY Right    age 43  . LIPOSUCTION  5/05    Family History  Problem Relation Age of Onset  . Diabetes Father   . Hypertension Father   . Heart disease Father        CABG  . Cancer Mother 58       cancer unknown primary  . COPD Mother   . Breast cancer Paternal Aunt   . Colon cancer Neg Hx     Allergies  Allergen Reactions  . Acetaminophen Anaphylaxis    REACTION: swells throat shut/can't breathe  . Clindamycin/Lincomycin Other (See Comments)    Cause Loose Stools// Diarrhea    Current Outpatient Medications on File Prior to Visit  Medication Sig Dispense Refill  . b complex vitamins tablet Take 1 tablet by mouth daily.    Marland Kitchen buPROPion (WELLBUTRIN XL) 300 MG 24 hr tablet Take 1 tablet (300 mg total) by mouth daily. 90 tablet 1  . CALCIUM PO Take 1,200 mg by mouth daily.    . Cholecalciferol (D3-1000 PO) Take 1 tablet by mouth daily.    . fluticasone (FLONASE) 50 MCG/ACT nasal spray Place 2 sprays into both nostrils daily. 16 g 6  . FOLIC ACID PO Take by mouth.    . Probiotic Product (PROBIOTIC PO) Take by mouth. Women's Probiotic - [place]     No current facility-administered medications on file prior to visit.     BP (!) 160/90   Temp 97.7 F (36.5 C)   Ht 4' 11.5" (1.511 m) Comment: without shoes  Wt 105 lb (47.6 kg)   BMI 20.85 kg/m       Objective:   Physical Exam  Constitutional: She is oriented to person, place, and time. She appears well-developed and well-nourished. No distress.  HENT:  Head: Normocephalic and atraumatic.  Right Ear: External ear normal.  Left Ear: External ear normal.  Nose: Nose normal.  Mouth/Throat: Oropharynx is clear and moist. No oropharyngeal exudate.  Eyes: Pupils are equal, round, and reactive to light. Conjunctivae and EOM are normal. Right eye exhibits no discharge. Left eye exhibits no discharge. No scleral icterus.  Neck: Normal range of motion. Neck supple. No JVD  present. No tracheal deviation present. No thyromegaly present.  Cardiovascular: Normal rate, regular rhythm, normal heart sounds and intact distal pulses. Exam reveals no gallop and no friction rub.  No murmur heard. Pulmonary/Chest: Effort normal and breath sounds normal. No respiratory distress. She has no wheezes. She has no rales. She exhibits no  tenderness.  Abdominal: Soft. Bowel sounds are normal. She exhibits no distension and no mass. There is no tenderness. There is no rebound and no guarding. No hernia.  Musculoskeletal: Normal range of motion. She exhibits no edema, tenderness or deformity.  Lymphadenopathy:    She has no cervical adenopathy.  Neurological: She is alert and oriented to person, place, and time. She displays normal reflexes. No cranial nerve deficit or sensory deficit. She exhibits normal muscle tone. Coordination normal.  Skin: Skin is warm and dry. Capillary refill takes less than 2 seconds. No rash noted. She is not diaphoretic. No erythema. No pallor.  Psychiatric: She has a normal mood and affect. Her behavior is normal. Judgment and thought content normal.  Nursing note and vitals reviewed.     Assessment & Plan:  1. Routine general medical examination at a health care facility - Continue with lifestyle modifications  - Follow up in one year or sooner if needed - CBC with Differential/Platelet - Comprehensive metabolic panel - Lipid panel - TSH  2. Depression, unspecified depression type - Continue with Wellbutrin - well controlled   3. Hyperlipidemia, unspecified hyperlipidemia type - Consider statin  - Hopefully with lifestyle modification her cholesterol panel has improved  - CBC with Differential/Platelet - Comprehensive metabolic panel - Lipid panel - TSH  4. Elevated blood pressure reading - Will have her monitor BP at home  - Follow up in one month or sooner if needed  5. Need for hepatitis C screening test  - Hep C Antibody  Dorothyann Peng, NP

## 2017-12-14 LAB — HEPATITIS C ANTIBODY
Hepatitis C Ab: NONREACTIVE
SIGNAL TO CUT-OFF: 0.03 (ref <1.00)

## 2017-12-27 DIAGNOSIS — Z0100 Encounter for examination of eyes and vision without abnormal findings: Secondary | ICD-10-CM | POA: Diagnosis not present

## 2018-01-13 ENCOUNTER — Other Ambulatory Visit: Payer: Self-pay

## 2018-01-13 ENCOUNTER — Encounter: Payer: Self-pay | Admitting: Obstetrics & Gynecology

## 2018-01-13 ENCOUNTER — Ambulatory Visit (INDEPENDENT_AMBULATORY_CARE_PROVIDER_SITE_OTHER): Payer: Medicare HMO | Admitting: Obstetrics & Gynecology

## 2018-01-13 ENCOUNTER — Other Ambulatory Visit (HOSPITAL_COMMUNITY)
Admission: RE | Admit: 2018-01-13 | Discharge: 2018-01-13 | Disposition: A | Discharge status: Home / Self Care | Payer: Medicare HMO | Source: Ambulatory Visit | Attending: Obstetrics & Gynecology | Admitting: Obstetrics & Gynecology

## 2018-01-13 VITALS — BP 158/90 | HR 80 | Resp 16 | Ht 59.5 in | Wt 105.2 lb

## 2018-01-13 DIAGNOSIS — B977 Papillomavirus as the cause of diseases classified elsewhere: Secondary | ICD-10-CM | POA: Insufficient documentation

## 2018-01-13 DIAGNOSIS — R69 Illness, unspecified: Secondary | ICD-10-CM | POA: Diagnosis not present

## 2018-01-13 DIAGNOSIS — Z01419 Encounter for gynecological examination (general) (routine) without abnormal findings: Secondary | ICD-10-CM | POA: Diagnosis not present

## 2018-01-13 DIAGNOSIS — M8580 Other specified disorders of bone density and structure, unspecified site: Secondary | ICD-10-CM | POA: Diagnosis not present

## 2018-01-13 DIAGNOSIS — Z124 Encounter for screening for malignant neoplasm of cervix: Secondary | ICD-10-CM

## 2018-01-13 NOTE — Patient Instructions (Addendum)
Outpatient Pharmacy at [facility] [place], [place] [address]  Main: [phone]   [facility] and [facility]

## 2018-01-13 NOTE — Progress Notes (Signed)
67 y.o. G57P3003 Widowed White or Caucasian female here for annual exam.  Has been followed this year for unintentional weight loss.  Is eating three meals a day and taking vitamins.  Feels the anxiety of the positive HPV testing is part of what added to her stressors this year.  Denies vaginal bleeding.  Patient's last menstrual period was 01/26/2002 (approximate).          Sexually active: No.  The current method of family planning is post menopausal status.    Exercising: Yes.    active  Smoker:  no  Health Maintenance: Pap:  01/06/17 ASC, HR HPV:+detected  History of abnormal Pap:  yes MMG:  10/07/17 BIRADS1:Neg  Colonoscopy:  04/26/13 Normal. F/u 10 years  BMD:   07/12/04 osteopenia  TDaP:  2016 Pneumonia vaccine(s):  2019 Shingrix:   D/w pt today Hep C testing: 12/13/17 Neg  Screening Labs: PCP   reports that she has never smoked. She has never used smokeless tobacco. She reports current alcohol use of about 14.0 standard drinks of alcohol per week. She reports that she does not use drugs.  Past Medical History:  Diagnosis Date  . Depression   . Hyperlipidemia     Past Surgical History:  Procedure Laterality Date  . COLONOSCOPY    . FOOT SURGERY  01/15/2012   right  . FOOT SURGERY  July 2014   right  . KNEE ARTHROSCOPY Right    age 56  . LIPOSUCTION  5/05    Current Outpatient Medications  Medication Sig Dispense Refill  . ALPRAZolam (XANAX) 0.5 MG tablet Take 0.5 mg by mouth at bedtime as needed for anxiety.    Marland Kitchen b complex vitamins tablet Take 1 tablet by mouth daily.    Marland Kitchen buPROPion (WELLBUTRIN XL) 300 MG 24 hr tablet Take 1 tablet (300 mg total) by mouth daily. 90 tablet 1  . CALCIUM PO Take 1,200 mg by mouth daily.    . Cholecalciferol (D3-1000 PO) Take 1 tablet by mouth daily.    . fluticasone (FLONASE) 50 MCG/ACT nasal spray Place 2 sprays into both nostrils daily. 16 g 6  . FOLIC ACID PO Take by mouth.    . Probiotic Product (PROBIOTIC PO) Take by mouth.  Women's Probiotic - [place] of [place]     No current facility-administered medications for this visit.     Family History  Problem Relation Age of Onset  . Diabetes Father   . Hypertension Father   . Heart disease Father        CABG  . Cancer Mother 26       cancer unknown primary  . COPD Mother   . Breast cancer Paternal Aunt   . Colon cancer Neg Hx     Review of Systems  Psychiatric/Behavioral: The patient is nervous/anxious.        Depression   All other systems reviewed and are negative.   Exam:   BP (!) 158/90 (BP Location: Left Arm, Patient Position: Sitting, Cuff Size: Normal)   Pulse 80   Resp 16   Ht 4' 11.5" (1.511 m)   Wt 105 lb 3.2 oz (47.7 kg)   LMP 01/26/2002 (Approximate)   BMI 20.89 kg/m  Height: 4' 11.5" (151.1 cm)  Ht Readings from Last 3 Encounters:  01/13/18 4' 11.5" (1.511 m)  12/13/17 4' 11.5" (1.511 m)  01/06/17 4\' 11"  (1.499 m)    General appearance: alert, cooperative and appears stated age Head: Normocephalic, without obvious abnormality, atraumatic  Neck: no adenopathy, supple, symmetrical, trachea midline and thyroid normal to inspection and palpation Lungs: clear to auscultation bilaterally Breasts: normal appearance, no masses or tenderness Heart: regular rate and rhythm Abdomen: soft, non-tender; bowel sounds normal; no masses,  no organomegaly Extremities: extremities normal, atraumatic, no cyanosis or edema Skin: Skin color, texture, turgor normal. No rashes or lesions Lymph nodes: Cervical, supraclavicular, and axillary nodes normal. No abnormal inguinal nodes palpated Neurologic: Grossly normal   Pelvic: External genitalia:  no lesions              Urethra:  normal appearing urethra with no masses, tenderness or lesions              Bartholins and Skenes: normal                 Vagina: normal appearing vagina with normal color and discharge, no lesions              Cervix: no lesions              Pap taken: Yes.   Bimanual  Exam:  Uterus:  normal size, contour, position, consistency, mobility, non-tender              Adnexa: normal adnexa and no mass, fullness, tenderness               Rectovaginal: Confirms               Anus:  normal sphincter tone, no lesions  Chaperone was present for exam.  A:  Well Woman with normal exam PMP, no HRT ASCUS pap with +HR HPV, colposcopy 02/02/17 Osteopenia Mildly elevated lipids  P:   Mammogram guidelines reviewed.  Doing yearly. pap smear and HR HPV obtained today Vaccines and lab work UTD except Shingrix vaccination.  She has not decided if she is going to receive this or now at this time Pt did not do BMD last year.  Order placed and telephone number given to pt to call and schedule return annually or prn

## 2018-01-17 LAB — CYTOLOGY - PAP
Diagnosis: NEGATIVE
HPV 16/18/45 genotyping: POSITIVE — AB
HPV: DETECTED — AB

## 2018-01-21 ENCOUNTER — Telehealth: Payer: Self-pay | Admitting: *Deleted

## 2018-01-21 DIAGNOSIS — B977 Papillomavirus as the cause of diseases classified elsewhere: Secondary | ICD-10-CM

## 2018-01-21 NOTE — Telephone Encounter (Signed)
Patient returned call

## 2018-01-21 NOTE — Telephone Encounter (Signed)
Notes recorded by Burnice Logan, RN on 01/21/2018 at 2:03 PM EST Left message to call Sharee Pimple, RN at [facility].

## 2018-01-21 NOTE — Telephone Encounter (Signed)
Spoke with patient, advised as seen below per Dr. Sabra Heck. Patient is familiar with colpo procedure. Patient is postmenopausal. Colpo scheduled for 01/31/18 at 11am with Dr. Sabra Heck. Advised to take Motrin 800 mg with food and water one hour before procedure.  Order placed for precert.   Patient verbalizes understanding and is agreeable.   Routing to [employer] .   Encounter closed.

## 2018-01-21 NOTE — Telephone Encounter (Signed)
-----   Message from Megan Salon, MD sent at 01/20/2018 11:57 AM EST ----- Please let pt know her pap was normal but HR HPV was positive.  Needs colposcopy again this year.

## 2018-01-24 MED FILL — SHINGRIX 50 MCG SUS: 50 | 1 days supply | Qty: 1 | Fill #0

## 2018-01-27 ENCOUNTER — Telehealth: Payer: Self-pay | Admitting: Obstetrics & Gynecology

## 2018-01-27 NOTE — Telephone Encounter (Signed)
Call paced to convey benefits for colposcopy. Patient answered the phone but was unable to talk. She states she is on a business call and will call back later.

## 2018-01-31 ENCOUNTER — Ambulatory Visit: Payer: Medicare HMO | Admitting: Obstetrics & Gynecology

## 2018-01-31 VITALS — BP 180/90 | HR 66 | Resp 14 | Ht 59.0 in | Wt 105.0 lb

## 2018-01-31 DIAGNOSIS — N3941 Urge incontinence: Secondary | ICD-10-CM

## 2018-01-31 DIAGNOSIS — B977 Papillomavirus as the cause of diseases classified elsewhere: Secondary | ICD-10-CM

## 2018-01-31 DIAGNOSIS — N858 Other specified noninflammatory disorders of uterus: Secondary | ICD-10-CM | POA: Diagnosis not present

## 2018-01-31 DIAGNOSIS — R69 Illness, unspecified: Secondary | ICD-10-CM | POA: Diagnosis not present

## 2018-01-31 LAB — POCT URINALYSIS DIPSTICK
Bilirubin, UA: NEGATIVE
Blood, UA: NEGATIVE
Glucose, UA: NEGATIVE
KETONES UA: NEGATIVE
Leukocytes, UA: NEGATIVE
NITRITE UA: NEGATIVE
PH UA: 5 (ref 5.0–8.0)
PROTEIN UA: NEGATIVE
UROBILINOGEN UA: NEGATIVE U/dL — AB

## 2018-02-01 ENCOUNTER — Encounter: Payer: Self-pay | Admitting: Obstetrics & Gynecology

## 2018-02-01 NOTE — Progress Notes (Signed)
68 y.o. G35P3 Widowed Caucasian female here for colposcopy with possible biopsies and/or ECC due to +HR HPV obtained with pap on 01/13/18.  Pt has ASCUS pap with +HR HPV 1 year ago and had a colposcopy at that time.  She has really struggled emotionally with this positive HR HPV testing.  Feels she does not deserve this based on the way that she has lived and made choices previously.  Asks similar questions to those at [facility] and last year.  Questions all addressed.      Patient's last menstrual period was 01/26/2002 (approximate).          Sexually active: No.  The current method of family planning is post menopausal status.     Patient has been counseled about results and procedure.  Risks and benefits have bene reviewed including immediate and/or delayed bleeding, infection, cervical scaring from procedure, possibility of needing additional follow up as well as treatment.  rare risks of missing a lesion discussed as well.  All questions answered.  Pt ready to proceed.  BP (!) 180/90   Pulse 66   Resp 14   Ht 4\' 11"  (1.499 m)   Wt 105 lb (47.6 kg)   LMP 01/26/2002 (Approximate)   BMI 21.21 kg/m   Physical Exam  Constitutional: She is oriented to person, place, and time. She appears well-developed and well-nourished.  Genitourinary:    Vagina normal.  There is no rash, tenderness, lesion or injury on the right labia. There is no rash, tenderness, lesion or injury on the left labia.  Lymphadenopathy:       Right: No inguinal adenopathy present.       Left: No inguinal adenopathy present.  Neurological: She is alert and oriented to person, place, and time.  Skin: Skin is warm and dry.  Psychiatric: She has a normal mood and affect.    Speculum placed.  3% acetic acid applied to cervix for >45 seconds.  Cervix visualized with both 7.5X and 15X magnification.  Green filter also used.  Lugols solution was used.  Findings:  No abnormal lesions or staining noted on the cervix.  Biopsy:  Not  indicated.  ECC:  was not performed.  Monsel's was not needed.  Excellent hemostasis was present.  Pt tolerated procedure well and all instruments were removed.  Colposcopy was adequate.  Assessment:  +HR HPV on pap smear with h/o ASCUS pap with +HR HPV last year  Plan:  Pathology results will be called to patient and follow-up planned pending results.

## 2018-02-07 ENCOUNTER — Telehealth: Payer: Self-pay

## 2018-02-07 NOTE — Telephone Encounter (Signed)
-----   Message from Megan Salon, MD sent at 02/04/2018  8:38 AM EST ----- Please let pt know her ECC was negative for abnormal cells.  Needs to repeat pap and HR HPV 1 year.  Please make sure appt is in approximately 12 months.  Out of current recall.  Needs 08 recall.  Thanks.

## 2018-02-07 NOTE — Telephone Encounter (Signed)
Spoke with patient. Results given. Patient verbalizes understanding. [facility] moved up to 02/02/2019 at 8:45 am with Dr.Miller. Patient is agreeable to date and time. 08 recall entered. Encounter closed.

## 2018-03-08 DIAGNOSIS — Z0100 Encounter for examination of eyes and vision without abnormal findings: Secondary | ICD-10-CM | POA: Diagnosis not present

## 2018-03-15 DIAGNOSIS — R69 Illness, unspecified: Secondary | ICD-10-CM | POA: Diagnosis not present

## 2018-03-16 DIAGNOSIS — H57813 Brow ptosis, bilateral: Secondary | ICD-10-CM | POA: Diagnosis not present

## 2018-03-16 DIAGNOSIS — H53483 Generalized contraction of visual field, bilateral: Secondary | ICD-10-CM | POA: Diagnosis not present

## 2018-03-16 DIAGNOSIS — H02834 Dermatochalasis of left upper eyelid: Secondary | ICD-10-CM | POA: Diagnosis not present

## 2018-03-16 DIAGNOSIS — H02831 Dermatochalasis of right upper eyelid: Secondary | ICD-10-CM | POA: Diagnosis not present

## 2018-03-16 DIAGNOSIS — H02413 Mechanical ptosis of bilateral eyelids: Secondary | ICD-10-CM | POA: Diagnosis not present

## 2018-03-16 DIAGNOSIS — H0279 Other degenerative disorders of eyelid and periocular area: Secondary | ICD-10-CM | POA: Diagnosis not present

## 2018-03-21 DIAGNOSIS — H53482 Generalized contraction of visual field, left eye: Secondary | ICD-10-CM | POA: Diagnosis not present

## 2018-03-21 DIAGNOSIS — H53481 Generalized contraction of visual field, right eye: Secondary | ICD-10-CM | POA: Diagnosis not present

## 2018-03-21 DIAGNOSIS — H53483 Generalized contraction of visual field, bilateral: Secondary | ICD-10-CM | POA: Diagnosis not present

## 2018-03-28 MED FILL — SHINGRIX 50 MCG SUS: 50 | 1 days supply | Qty: 1 | Fill #1

## 2018-04-22 ENCOUNTER — Other Ambulatory Visit: Payer: Self-pay | Admitting: Adult Health

## 2018-07-05 DIAGNOSIS — H02831 Dermatochalasis of right upper eyelid: Secondary | ICD-10-CM | POA: Diagnosis not present

## 2018-07-05 DIAGNOSIS — H02834 Dermatochalasis of left upper eyelid: Secondary | ICD-10-CM | POA: Diagnosis not present

## 2018-07-31 ENCOUNTER — Other Ambulatory Visit: Payer: Self-pay | Admitting: Adult Health

## 2018-08-03 ENCOUNTER — Other Ambulatory Visit: Payer: Self-pay | Admitting: Adult Health

## 2018-08-03 NOTE — Telephone Encounter (Signed)
DUPLICATE REQUEST.  THIS WAS FILLED THIS MORNING.  MESSAGE SENT TO THE PHARMACY.

## 2018-08-03 NOTE — Telephone Encounter (Signed)
Sent to the pharmacy by e-scribe. 

## 2018-08-15 ENCOUNTER — Other Ambulatory Visit: Payer: Self-pay

## 2018-08-15 ENCOUNTER — Ambulatory Visit (INDEPENDENT_AMBULATORY_CARE_PROVIDER_SITE_OTHER): Payer: Medicare HMO | Admitting: Family Medicine

## 2018-08-15 ENCOUNTER — Encounter: Payer: Self-pay | Admitting: Family Medicine

## 2018-08-15 VITALS — BP 130/64 | HR 68 | Temp 98.2°F | Wt 109.4 lb

## 2018-08-15 DIAGNOSIS — S81811A Laceration without foreign body, right lower leg, initial encounter: Secondary | ICD-10-CM | POA: Diagnosis not present

## 2018-08-15 DIAGNOSIS — L03115 Cellulitis of right lower limb: Secondary | ICD-10-CM

## 2018-08-15 MED ORDER — CEPHALEXIN 500 MG PO CAPS: 500.0000 mg | ORAL_CAPSULE | Freq: Three times a day (TID) | ORAL | 0 refills | Status: DC

## 2018-08-15 NOTE — Progress Notes (Signed)
   Subjective:    Patient ID: Denise Bradley, female    DOB: [birth year], 68 y.o.   MRN: [id number]  HPI Here to check a scrape on the right shin. One week ago while shaving her legs, she scraped the anterior right lower leg. She dressed it with Neosporin and gauze for 2 days and they let it go dry. Since then it has scabbed over but it is tender and pink and warm. She is concerned it may be infected.    Review of Systems  Constitutional: Negative.   Respiratory: Negative.   Cardiovascular: Negative.   Skin: Positive for wound.       Objective:   Physical Exam Constitutional:      Appearance: Normal appearance.  Cardiovascular:     Rate and Rhythm: Normal rate and regular rhythm.     Pulses: Normal pulses.     Heart sounds: Normal heart sounds.  Pulmonary:     Effort: Pulmonary effort is normal.     Breath sounds: Normal breath sounds.  Skin:    Comments: The right shin has a superficial scraping type laceration that is linear and about 5 cm in length. The area around it is pink, warm, and slightly tender   Neurological:     Mental Status: She is alert.           Assessment & Plan:  Slight cellulitis at the site of a laceration. We will cover with Keflex for 7 days. Recheck prn.  Alysia Penna, MD

## 2018-08-24 ENCOUNTER — Other Ambulatory Visit: Payer: Self-pay | Admitting: Obstetrics & Gynecology

## 2018-08-24 DIAGNOSIS — Z1231 Encounter for screening mammogram for malignant neoplasm of breast: Secondary | ICD-10-CM

## 2018-08-31 DIAGNOSIS — H02844 Edema of left upper eyelid: Secondary | ICD-10-CM | POA: Diagnosis not present

## 2018-08-31 DIAGNOSIS — H0014 Chalazion left upper eyelid: Secondary | ICD-10-CM | POA: Diagnosis not present

## 2018-08-31 DIAGNOSIS — H02841 Edema of right upper eyelid: Secondary | ICD-10-CM | POA: Diagnosis not present

## 2018-08-31 DIAGNOSIS — Z09 Encounter for follow-up examination after completed treatment for conditions other than malignant neoplasm: Secondary | ICD-10-CM | POA: Diagnosis not present

## 2018-08-31 DIAGNOSIS — H0011 Chalazion right upper eyelid: Secondary | ICD-10-CM | POA: Diagnosis not present

## 2018-09-13 DIAGNOSIS — H04123 Dry eye syndrome of bilateral lacrimal glands: Secondary | ICD-10-CM | POA: Diagnosis not present

## 2018-09-14 DIAGNOSIS — H02413 Mechanical ptosis of bilateral eyelids: Secondary | ICD-10-CM | POA: Diagnosis not present

## 2018-09-14 DIAGNOSIS — H57813 Brow ptosis, bilateral: Secondary | ICD-10-CM | POA: Diagnosis not present

## 2018-09-14 DIAGNOSIS — H02423 Myogenic ptosis of bilateral eyelids: Secondary | ICD-10-CM | POA: Diagnosis not present

## 2018-09-14 DIAGNOSIS — L298 Other pruritus: Secondary | ICD-10-CM | POA: Diagnosis not present

## 2018-09-14 DIAGNOSIS — Z09 Encounter for follow-up examination after completed treatment for conditions other than malignant neoplasm: Secondary | ICD-10-CM | POA: Diagnosis not present

## 2018-09-27 DIAGNOSIS — R69 Illness, unspecified: Secondary | ICD-10-CM | POA: Diagnosis not present

## 2018-10-11 ENCOUNTER — Ambulatory Visit: Payer: Medicare HMO

## 2018-10-19 ENCOUNTER — Other Ambulatory Visit: Payer: Self-pay

## 2018-10-19 ENCOUNTER — Ambulatory Visit
Admission: RE | Admit: 2018-10-19 | Discharge: 2018-10-19 | Disposition: A | Discharge status: Home / Self Care | Payer: Medicare HMO | Source: Ambulatory Visit | Attending: Obstetrics & Gynecology | Admitting: Obstetrics & Gynecology

## 2018-10-19 DIAGNOSIS — Z1231 Encounter for screening mammogram for malignant neoplasm of breast: Secondary | ICD-10-CM

## 2018-11-05 ENCOUNTER — Other Ambulatory Visit: Payer: Self-pay | Admitting: Adult Health

## 2018-11-08 NOTE — Telephone Encounter (Signed)
SENT TO THE PHARMACY BY E-SCRIBE FOR 90 DAYS.  PT HAS UPCOMING CPX. 

## 2018-11-14 DIAGNOSIS — H04212 Epiphora due to excess lacrimation, left lacrimal gland: Secondary | ICD-10-CM | POA: Diagnosis not present

## 2018-11-14 DIAGNOSIS — H04223 Epiphora due to insufficient drainage, bilateral lacrimal glands: Secondary | ICD-10-CM | POA: Diagnosis not present

## 2018-11-14 DIAGNOSIS — H04222 Epiphora due to insufficient drainage, left lacrimal gland: Secondary | ICD-10-CM | POA: Diagnosis not present

## 2018-11-14 DIAGNOSIS — H02423 Myogenic ptosis of bilateral eyelids: Secondary | ICD-10-CM | POA: Diagnosis not present

## 2018-11-14 DIAGNOSIS — Z09 Encounter for follow-up examination after completed treatment for conditions other than malignant neoplasm: Secondary | ICD-10-CM | POA: Diagnosis not present

## 2018-11-14 DIAGNOSIS — H57813 Brow ptosis, bilateral: Secondary | ICD-10-CM | POA: Diagnosis not present

## 2018-11-17 ENCOUNTER — Ambulatory Visit: Payer: Medicare HMO

## 2018-12-05 DIAGNOSIS — H11432 Conjunctival hyperemia, left eye: Secondary | ICD-10-CM | POA: Diagnosis not present

## 2018-12-05 DIAGNOSIS — H02423 Myogenic ptosis of bilateral eyelids: Secondary | ICD-10-CM | POA: Diagnosis not present

## 2018-12-05 DIAGNOSIS — H04123 Dry eye syndrome of bilateral lacrimal glands: Secondary | ICD-10-CM | POA: Diagnosis not present

## 2018-12-05 DIAGNOSIS — H04212 Epiphora due to excess lacrimation, left lacrimal gland: Secondary | ICD-10-CM | POA: Diagnosis not present

## 2018-12-05 DIAGNOSIS — H04222 Epiphora due to insufficient drainage, left lacrimal gland: Secondary | ICD-10-CM | POA: Diagnosis not present

## 2018-12-05 DIAGNOSIS — H57813 Brow ptosis, bilateral: Secondary | ICD-10-CM | POA: Diagnosis not present

## 2018-12-05 DIAGNOSIS — H04223 Epiphora due to insufficient drainage, bilateral lacrimal glands: Secondary | ICD-10-CM | POA: Diagnosis not present

## 2018-12-12 DIAGNOSIS — H2513 Age-related nuclear cataract, bilateral: Secondary | ICD-10-CM | POA: Diagnosis not present

## 2018-12-12 DIAGNOSIS — H524 Presbyopia: Secondary | ICD-10-CM | POA: Diagnosis not present

## 2019-01-03 ENCOUNTER — Ambulatory Visit (INDEPENDENT_AMBULATORY_CARE_PROVIDER_SITE_OTHER): Payer: Medicare HMO | Admitting: Adult Health

## 2019-01-03 ENCOUNTER — Encounter: Payer: Self-pay | Admitting: Adult Health

## 2019-01-03 ENCOUNTER — Other Ambulatory Visit: Payer: Self-pay

## 2019-01-03 VITALS — BP 146/80 | Temp 98.2°F | Ht 59.0 in | Wt 110.0 lb

## 2019-01-03 DIAGNOSIS — S0120XA Unspecified open wound of nose, initial encounter: Secondary | ICD-10-CM | POA: Diagnosis not present

## 2019-01-03 DIAGNOSIS — Z0000 Encounter for general adult medical examination without abnormal findings: Secondary | ICD-10-CM

## 2019-01-03 DIAGNOSIS — E782 Mixed hyperlipidemia: Secondary | ICD-10-CM | POA: Diagnosis not present

## 2019-01-03 DIAGNOSIS — Z23 Encounter for immunization: Secondary | ICD-10-CM

## 2019-01-03 LAB — COMPREHENSIVE METABOLIC PANEL
ALT: 20 U/L (ref 0–35)
AST: 23 U/L (ref 0–37)
Albumin: 4.5 g/dL (ref 3.5–5.2)
Alkaline Phosphatase: 71 U/L (ref 39–117)
BUN: 16 mg/dL (ref 6–23)
CO2: 29 mEq/L (ref 19–32)
Calcium: 9.4 mg/dL (ref 8.4–10.5)
Chloride: 104 mEq/L (ref 96–112)
Creatinine, Ser: 0.99 mg/dL (ref 0.40–1.20)
GFR: 55.97 mL/min — ABNORMAL LOW (ref >60.00)
Glucose, Bld: 89 mg/dL (ref 70–99)
Potassium: 4.3 mEq/L (ref 3.5–5.1)
Sodium: 139 mEq/L (ref 135–145)
Total Bilirubin: 0.6 mg/dL (ref 0.2–1.2)
Total Protein: 6.7 g/dL (ref 6.0–8.3)

## 2019-01-03 LAB — CBC WITH DIFFERENTIAL/PLATELET
Basophils Absolute: 0 10*3/uL (ref 0.0–0.1)
Basophils Relative: 0.6 % (ref 0.0–3.0)
Eosinophils Absolute: 0.1 10*3/uL (ref 0.0–0.7)
Eosinophils Relative: 1.9 % (ref 0.0–5.0)
HCT: 42.8 % (ref 36.0–46.0)
Hemoglobin: 13.9 g/dL (ref 12.0–15.0)
Lymphocytes Relative: 40.9 % (ref 12.0–46.0)
Lymphs Abs: 2.2 10*3/uL (ref 0.7–4.0)
MCHC: 32.3 g/dL (ref 30.0–36.0)
MCV: 90 fl (ref 78.0–100.0)
Monocytes Absolute: 0.6 10*3/uL (ref 0.1–1.0)
Monocytes Relative: 10.5 % (ref 3.0–12.0)
Neutro Abs: 2.5 10*3/uL (ref 1.4–7.7)
Neutrophils Relative %: 46.1 % (ref 43.0–77.0)
Platelets: 193 10*3/uL (ref 150.0–400.0)
RBC: 4.76 Mil/uL (ref 3.87–5.11)
RDW: 13.2 % (ref 11.5–15.5)
WBC: 5.5 10*3/uL (ref 4.0–10.5)

## 2019-01-03 LAB — LIPID PANEL
Cholesterol: 208 mg/dL — ABNORMAL HIGH (ref 0–200)
HDL: 83.2 mg/dL (ref >39.00)
LDL Cholesterol: 110 mg/dL — ABNORMAL HIGH (ref 0–99)
NonHDL: 124.33
Total CHOL/HDL Ratio: 2
Triglycerides: 71 mg/dL (ref 0.0–149.0)
VLDL: 14.2 mg/dL (ref 0.0–40.0)

## 2019-01-03 LAB — TSH: TSH: 1.89 u[IU]/mL (ref 0.35–4.50)

## 2019-01-03 NOTE — Progress Notes (Signed)
Subjective:    Patient ID: Denise Bradley, female    DOB: 05-29-1950, 68 y.o.   MRN: [id number]  HPI Patient presents for yearly preventative medicine examination. She is a pleasant 68 year old female who  has a past medical history of Depression and Hyperlipidemia.  Depression - Well controlled with Wellbutrin 300 mg.   Hyperlipidemia - not currently prescribed medication  The 10-year ASCVD risk score Mikey Bussing DC Jr., et al., 2013) is: 7.7%   Values used to calculate the score:     Age: 32 years     Sex: Female     Is Non-Hispanic African American: No     Diabetic: No     Tobacco smoker: No     Systolic Blood Pressure: 123456 mmHg     Is BP treated: No     HDL Cholesterol: 96.8 mg/dL     Total Cholesterol: 238 mg/dL  Wound in left nare -she reports that she has had a wound in her left nare that has been present on and off for multiple years.  She does report bleeding when she picks off the scab as well as irritation.  All immunizations and health maintenance protocols were reviewed with the patient and needed orders were placed. She is due for flu and prevnar 23.   Appropriate screening laboratory values were ordered for the patient including screening of hyperlipidemia, renal function and hepatic function.   Medication reconciliation,  past medical history, social history, problem list and allergies were reviewed in detail with the patient  Goals were established with regard to weight loss, exercise, and  diet in compliance with medications. She eats healthy and exercises on a routine basis  End of life planning was discussed.  She is up to date on routine dental, vision, colonoscopy and GYN care   Review of Systems  Constitutional: Negative.   HENT: Negative.   Eyes: Negative.   Respiratory: Negative.   Cardiovascular: Negative.   Gastrointestinal: Negative.   Endocrine: Negative.   Genitourinary: Negative.   Musculoskeletal: Negative.   Skin: Negative.    Allergic/Immunologic: Negative.   Neurological: Negative.   Hematological: Negative.   Psychiatric/Behavioral: Negative.    Past Medical History:  Diagnosis Date  . Depression   . Hyperlipidemia     Social History   Socioeconomic History  . Marital status: Widowed    Spouse name: Not on file  . Number of children: Not on file  . Years of education: Not on file  . Highest education level: Not on file  Occupational History  . Not on file  Social Needs  . Financial resource strain: Not on file  . Food insecurity    Worry: Not on file    Inability: Not on file  . Transportation needs    Medical: Not on file    Non-medical: Not on file  Tobacco Use  . Smoking status: Never Smoker  . Smokeless tobacco: Never Used  Substance and Sexual Activity  . Alcohol use: Yes    Alcohol/week: 14.0 standard drinks    Types: 14 Glasses of wine per week  . Drug use: No  . Sexual activity: Not Currently    Birth control/protection: Post-menopausal  Lifestyle  . Physical activity    Days per week: Not on file    Minutes per session: Not on file  . Stress: Not on file  Relationships  . Social Herbalist on phone: Not on file    Gets together:  Not on file    Attends religious service: Not on file    Active member of club or organization: Not on file    Attends meetings of clubs or organizations: Not on file    Relationship status: Not on file  . Intimate partner violence    Fear of current or ex partner: Not on file    Emotionally abused: Not on file    Physically abused: Not on file    Forced sexual activity: Not on file  Other Topics Concern  . Not on file  Social History Narrative   Retired - Worked with [employer] [employer] and last May she was laid off. She worked there for 22 years   Going back to school for [employer].    No pets   Three children ( [place], [place]. [place] ( Son and two daughters- all married. & grandchildren)   States " Does not do anything fun".           Diet: Eat healthy, does not eat fast food.    Exercise: Does not exercise, does not feel like she has time.        Past Surgical History:  Procedure Laterality Date  . COLONOSCOPY    . FOOT SURGERY  01/15/2012   right  . FOOT SURGERY  July 2014   right  . KNEE ARTHROSCOPY Right    age 50  . LIPOSUCTION  5/05    Family History  Problem Relation Age of Onset  . Diabetes Father   . Hypertension Father   . Heart disease Father        CABG  . Cancer Mother 42       cancer unknown primary  . COPD Mother   . Breast cancer Paternal Aunt   . Colon cancer Neg Hx     Allergies  Allergen Reactions  . Acetaminophen Anaphylaxis    REACTION: swells throat shut/can't breathe  . Clindamycin/Lincomycin Other (See Comments)    Cause Loose Stools// Diarrhea    Current Outpatient Medications on File Prior to Visit  Medication Sig Dispense Refill  . ALPRAZolam (XANAX) 0.5 MG tablet Take 0.5 mg by mouth at bedtime as needed for anxiety.    Marland Kitchen b complex vitamins tablet Take 1 tablet by mouth daily.    Marland Kitchen buPROPion (WELLBUTRIN XL) 300 MG 24 hr tablet TAKE ONE TABLET BY MOUTH ONE TIME DAILY  90 tablet 0  . CALCIUM PO Take 1,200 mg by mouth daily.    . Cholecalciferol (D3-1000 PO) Take 1 tablet by mouth daily.    . fluticasone (FLONASE) 50 MCG/ACT nasal spray Place 2 sprays into both nostrils daily. 16 g 6  . FOLIC ACID PO Take by mouth.    . Probiotic Product (PROBIOTIC PO) Take by mouth. Women's Probiotic - [place] of [place]     No current facility-administered medications on file prior to visit.     BP (!) 154/82   Temp 98.2 F (36.8 C)   Ht 4\' 11"  (1.499 m) Comment: WITHOUT SHOES  Wt 110 lb (49.9 kg)   LMP 01/26/2002 (Approximate)   BMI 22.22 kg/m       Objective:   Physical Exam Vitals signs and nursing note reviewed.  Constitutional:      Appearance: Normal appearance.  HENT:     Head: Normocephalic and atraumatic.     Right Ear: Tympanic membrane, ear canal and  external ear normal. There is no impacted cerumen.     Left Ear: Tympanic  membrane, ear canal and external ear normal.     Nose: Nose normal. No congestion.     Comments: She does have a small ulceration on the medial aspect of her left nare.  No active bleeding noted.    Mouth/Throat:     Mouth: Mucous membranes are dry.     Pharynx: Oropharynx is clear.  Eyes:     Extraocular Movements: Extraocular movements intact.     Conjunctiva/sclera: Conjunctivae normal.     Pupils: Pupils are equal, round, and reactive to light.  Cardiovascular:     Pulses: Normal pulses.     Heart sounds: Normal heart sounds. No murmur. No friction rub. No gallop.   Pulmonary:     Effort: Pulmonary effort is normal.     Breath sounds: Normal breath sounds.  Skin:    General: Skin is warm.     Coloration: Skin is not jaundiced or pale.     Findings: No bruising, erythema, lesion or rash.  Neurological:     General: No focal deficit present.     Mental Status: She is alert and oriented to person, place, and time.       Assessment & Plan:  1. Routine general medical examination at a health care facility - Continue to exercise and eat healthy  - Follow up in one year or sooner if needed.  - CBC with Differential/Platelet - CMP - Lipid panel - TSH  2. Need for 23-polyvalent pneumococcal polysaccharide vaccine  - Pneumococcal polysaccharide vaccine 23-valent greater than or equal to 2yo subcutaneous/IM  3. Need for prophylactic vaccination and inoculation against influenza  - Flu Vaccine QUAD High Dose(Fluad)  4. Open wound of nasal cavity, initial encounter - Due to time frame - Ambulatory referral to ENT  5. Mixed hyperlipidemia - Consider statin  - CBC with Differential/Platelet - CMP - Lipid panel - TSH   Dorothyann Peng, NP

## 2019-01-03 NOTE — Patient Instructions (Signed)
It was great seeing you today   I will follow up with you once I get your blood work back.   Continue to stay active and eat healthy   Please let me know if you need anything

## 2019-01-10 ENCOUNTER — Ambulatory Visit (INDEPENDENT_AMBULATORY_CARE_PROVIDER_SITE_OTHER): Payer: Medicare HMO | Admitting: Otolaryngology

## 2019-01-11 ENCOUNTER — Ambulatory Visit (INDEPENDENT_AMBULATORY_CARE_PROVIDER_SITE_OTHER): Payer: Medicare HMO | Admitting: Otolaryngology

## 2019-01-11 ENCOUNTER — Encounter (INDEPENDENT_AMBULATORY_CARE_PROVIDER_SITE_OTHER): Payer: Self-pay | Admitting: Otolaryngology

## 2019-01-11 ENCOUNTER — Other Ambulatory Visit: Payer: Self-pay

## 2019-01-11 VITALS — Temp 97.2°F

## 2019-01-11 DIAGNOSIS — J310 Chronic rhinitis: Secondary | ICD-10-CM

## 2019-01-11 DIAGNOSIS — J3489 Other specified disorders of nose and nasal sinuses: Secondary | ICD-10-CM

## 2019-01-11 NOTE — Progress Notes (Signed)
HPI: Denise Bradley is a 68 y.o. female who presents is referred by her PCP for evaluation of a chronic sore she is having in the left nostril that comes and goes.  She does have history of chronic postnasal drainage and allergies and uses Flonase intermittently.  But she has this recurring scabbing on the lower portion of the left septum that she feels frequently with her finger.  It occasionally bleeds when she picks at it. She also complains of chronic throat clearing.  However she is doing better today.  Past Medical History:  Diagnosis Date  . Depression   . Hyperlipidemia    Past Surgical History:  Procedure Laterality Date  . COLONOSCOPY    . FOOT SURGERY  01/15/2012   right  . FOOT SURGERY  July 2014   right  . KNEE ARTHROSCOPY Right    age 30  . LIPOSUCTION  5/05   Social History   Socioeconomic History  . Marital status: Widowed    Spouse name: Not on file  . Number of children: Not on file  . Years of education: Not on file  . Highest education level: Not on file  Occupational History  . Not on file  Tobacco Use  . Smoking status: Never Smoker  . Smokeless tobacco: Never Used  Substance and Sexual Activity  . Alcohol use: Yes    Alcohol/week: 14.0 standard drinks    Types: 14 Glasses of wine per week  . Drug use: No  . Sexual activity: Not Currently    Birth control/protection: Post-menopausal  Other Topics Concern  . Not on file  Social History Narrative   Retired - Worked with [employer] and last May she was laid off. She worked there for 22 years   Going back to school for [employer].    No pets   Three children ( [place], [place]. [place] ( Son and two daughters- all married. & grandchildren)   States " Does not do anything fun".          Diet: Eat healthy, does not eat fast food.    Exercise: Does not exercise, does not feel like she has time.       Social Determinants of Health   Financial Resource Strain:   . Difficulty of Paying  Living Expenses: Not on file  Food Insecurity:   . Worried About Charity fundraiser in the Last Year: Not on file  . Ran Out of Food in the Last Year: Not on file  Transportation Needs:   . Lack of Transportation (Medical): Not on file  . Lack of Transportation (Non-Medical): Not on file  Physical Activity:   . Days of Exercise per Week: Not on file  . Minutes of Exercise per Session: Not on file  Stress:   . Feeling of Stress : Not on file  Social Connections:   . Frequency of Communication with Friends and Family: Not on file  . Frequency of Social Gatherings with Friends and Family: Not on file  . Attends Religious Services: Not on file  . Active Member of Clubs or Organizations: Not on file  . Attends Archivist Meetings: Not on file  . Marital Status: Not on file   Family History  Problem Relation Age of Onset  . Diabetes Father   . Hypertension Father   . Heart disease Father        CABG  . Cancer Mother 68       cancer unknown  primary  . COPD Mother   . Breast cancer Paternal Aunt   . Colon cancer Neg Hx    Allergies  Allergen Reactions  . Acetaminophen Anaphylaxis    REACTION: swells throat shut/can't breathe  . Clindamycin/Lincomycin Other (See Comments)    Cause Loose Stools// Diarrhea   Prior to Admission medications   Medication Sig Start Date End Date Taking? Authorizing Provider  ALPRAZolam Duanne Moron) 0.5 MG tablet Take 0.5 mg by mouth at bedtime as needed for anxiety.   Yes [provider]  b complex vitamins tablet Take 1 tablet by mouth daily.   Yes [provider]  buPROPion (WELLBUTRIN XL) 300 MG 24 hr tablet TAKE ONE TABLET BY MOUTH ONE TIME DAILY  11/08/18  Yes Nafziger, Tommi Rumps, NP  CALCIUM PO Take 1,200 mg by mouth daily.   Yes [provider]  Cholecalciferol (D3-1000 PO) Take 1 tablet by mouth daily.   Yes [provider]  fluticasone (FLONASE) 50 MCG/ACT nasal spray Place 2 sprays into both nostrils  daily. 11/22/17  Yes Koberlein, Junell C, MD  FOLIC ACID PO Take by mouth.   Yes [provider]  Probiotic Product (PROBIOTIC PO) Take by mouth. Women's Probiotic - [place], Historical, MD     Positive ROS: Otherwise negative  All other systems have been reviewed and were otherwise negative with the exception of those mentioned in the HPI and as above.  Physical Exam: Constitutional: Alert, well-appearing, no acute distress Ears: External ears without lesions or tenderness. Ear canals are clear bilaterally with intact, clear TMs.  Nasal: External nose without lesions. Septum is relatively straight.  She has a little scabbing and crusting along the left inferior edge of the septum.  She has a small area of irritation on the right inferior septum.  There is several small blood vessels around the scabbing area.  No ulceration and no evidence of neoplasia clinically.  Remaining nasal cavity is clear.  Middle meatus regions are clear with no signs of infection. Oral: Lips and gums without lesions. Tongue and palate mucosa without lesions. Posterior oropharynx clear. Indirect laryngoscopy revealed a clear hypopharynx and larynx.  Vocal cords are clear.  Mild arytenoid edema. Neck: No palpable adenopathy or masses Respiratory: Breathing comfortably  Skin: No facial/neck lesions or rash noted.  Procedures  Assessment: Mild left nostril mucositis or crusting.  No evidence of neoplasia. Chronic rhinitis  Plan: Suggested stopping the nasal steroid spray for 3 to 4 weeks and use of saline nasal irrigation.  Also recommended application of mupirocin 2% ointment to the scabbing area twice a day for 1 week and avoid any trauma to this area When she uses the steroid spray in the future would recommend directing it more laterally as this could be contributing some to the irritation. She will follow-up as needed if symptoms worsen   Radene Journey, MD   CC:

## 2019-01-17 ENCOUNTER — Telehealth: Payer: Self-pay | Admitting: Adult Health

## 2019-01-17 DIAGNOSIS — M720 Palmar fascial fibromatosis [Dupuytren]: Secondary | ICD-10-CM

## 2019-01-17 NOTE — Telephone Encounter (Signed)
Referral Request - Has patient seen PCP for this complaint? Yes.   *If NO, is insurance requiring patient see PCP for this issue before PCP can refer them? Referral for which specialty: hand doctor Preferred provider/office: whatever insurance covers Reason for referral: patient has a spot/bump on right hand

## 2019-01-17 NOTE — Telephone Encounter (Signed)
Message Routed to PCP CMA 

## 2019-01-30 ENCOUNTER — Other Ambulatory Visit: Payer: Self-pay | Admitting: Adult Health

## 2019-01-31 ENCOUNTER — Other Ambulatory Visit: Payer: Self-pay

## 2019-02-02 ENCOUNTER — Ambulatory Visit: Payer: Medicare HMO | Admitting: Obstetrics & Gynecology

## 2019-02-10 ENCOUNTER — Other Ambulatory Visit: Payer: Self-pay

## 2019-02-14 ENCOUNTER — Other Ambulatory Visit (HOSPITAL_COMMUNITY)
Admission: RE | Admit: 2019-02-14 | Discharge: 2019-02-14 | Disposition: A | Discharge status: Home / Self Care | Payer: Medicare HMO | Source: Ambulatory Visit | Attending: Obstetrics & Gynecology | Admitting: Obstetrics & Gynecology

## 2019-02-14 ENCOUNTER — Encounter: Payer: Self-pay | Admitting: Obstetrics & Gynecology

## 2019-02-14 ENCOUNTER — Other Ambulatory Visit: Payer: Self-pay

## 2019-02-14 ENCOUNTER — Ambulatory Visit (INDEPENDENT_AMBULATORY_CARE_PROVIDER_SITE_OTHER): Payer: Medicare HMO | Admitting: Obstetrics & Gynecology

## 2019-02-14 VITALS — BP 118/68 | HR 68 | Temp 97.2°F | Resp 10 | Ht 59.25 in | Wt 110.6 lb

## 2019-02-14 DIAGNOSIS — B977 Papillomavirus as the cause of diseases classified elsewhere: Secondary | ICD-10-CM | POA: Diagnosis not present

## 2019-02-14 DIAGNOSIS — Z1151 Encounter for screening for human papillomavirus (HPV): Secondary | ICD-10-CM | POA: Insufficient documentation

## 2019-02-14 DIAGNOSIS — R87810 Cervical high risk human papillomavirus (HPV) DNA test positive: Secondary | ICD-10-CM | POA: Diagnosis not present

## 2019-02-14 DIAGNOSIS — Z124 Encounter for screening for malignant neoplasm of cervix: Secondary | ICD-10-CM | POA: Insufficient documentation

## 2019-02-14 DIAGNOSIS — M818 Other osteoporosis without current pathological fracture: Secondary | ICD-10-CM

## 2019-02-14 DIAGNOSIS — Z01419 Encounter for gynecological examination (general) (routine) without abnormal findings: Secondary | ICD-10-CM | POA: Diagnosis not present

## 2019-02-14 DIAGNOSIS — R69 Illness, unspecified: Secondary | ICD-10-CM | POA: Diagnosis not present

## 2019-02-14 MED ORDER — BUPROPION HCL ER (XL) 150 MG PO TB24: 150.0000 mg | ORAL_TABLET | Freq: Every day | ORAL | 2 refills | Status: DC

## 2019-02-14 NOTE — Progress Notes (Signed)
69 y.o. G88P3003 Widowed White or Caucasian female here for annual exam.  Denies vaginal bleeding.  Really would like to wean off Wellbutrin XL.  Slow taper discussed.  Will change to 150XL daily for at least a month.  She knows not to cut these in 1/2.    Patient's last menstrual period was 01/26/2002 (approximate).          Sexually active: No.  The current method of family planning is post menopausal status.    Exercising: No.  The patient does not participate in regular exercise at present. Smoker:  no  Health Maintenance: Pap:   01/31/18 Neg ECC  01/13/18 Neg:Pos HR HPV:Pos 16/18/45  01/06/17 ASC, HR HPV:+detected  History of abnormal Pap:  yes MMG:  10/19/18 BIRADS 1 negative/density b Colonoscopy:  04/26/13 Normal. F/u 10 years  BMD:   01/22/17 Osteoporosis  TDaP:  2016 Pneumonia vaccine(s):  Completed Shingrix:   Completed Hep C testing: 12/13/17 Neg Screening Labs: PCP   reports that she has never smoked. She has never used smokeless tobacco. She reports current alcohol use of about 7.0 standard drinks of alcohol per week. She reports that she does not use drugs.  Past Medical History:  Diagnosis Date  . Depression   . Hyperlipidemia     Past Surgical History:  Procedure Laterality Date  . BELPHAROPTOSIS REPAIR    . COLONOSCOPY    . FOOT SURGERY  01/15/2012   right  . FOOT SURGERY  July 2014   right  . KNEE ARTHROSCOPY Right    age 73  . LIPOSUCTION  5/05    Current Outpatient Medications  Medication Sig Dispense Refill  . ALPRAZolam (XANAX) 0.5 MG tablet Take 0.5 mg by mouth at bedtime as needed for anxiety.    Marland Kitchen b complex vitamins tablet Take 1 tablet by mouth daily.    Marland Kitchen buPROPion (WELLBUTRIN XL) 300 MG 24 hr tablet TAKE ONE TABLET BY MOUTH ONE TIME DAILY  90 tablet 2  . CALCIUM PO Take 1,200 mg by mouth daily.    . Cholecalciferol (D3-1000 PO) Take 1 tablet by mouth daily.    . fluticasone (FLONASE) 50 MCG/ACT nasal spray Place 2 sprays into both nostrils  daily. 16 g 6  . FOLIC ACID PO Take by mouth.    . Multiple Vitamins-Minerals (ZINC PO) Take 15 mg by mouth daily.    . mupirocin ointment (BACTROBAN) 2 % APPLY TO NOSTRIL TWICE A DAY FOR 5 7 DAYS    . NON FORMULARY Protandim Synergizer - take one caplet by mouth daily    . Probiotic Product (PROBIOTIC PO) Take by mouth. Women's Probiotic - Garden of Eden     No current facility-administered medications for this visit.    Family History  Problem Relation Age of Onset  . Diabetes Father   . Hypertension Father   . Heart disease Father        CABG  . Cancer Mother 58       cancer unknown primary  . COPD Mother   . Breast cancer Paternal Aunt   . Colon cancer Neg Hx     Review of Systems  All other systems reviewed and are negative.   Exam:   BP 118/68 (BP Location: Left Arm, Patient Position: Sitting, Cuff Size: Normal)   Pulse 68   Temp (!) 97.2 F (36.2 C) (Temporal)   Resp 10   Ht 4' 11.25" (1.505 m)   Wt 110 lb 9.6 oz (50.2 kg)  LMP 01/26/2002 (Approximate)   BMI 22.15 kg/m   Height: 4' 11.25" (150.5 cm)  Ht Readings from Last 3 Encounters:  02/14/19 4' 11.25" (1.505 m)  01/03/19 4\' 11"  (1.499 m)  01/31/18 4\' 11"  (1.499 m)    General appearance: alert, cooperative and appears stated age Head: Normocephalic, without obvious abnormality, atraumatic Neck: no adenopathy, supple, symmetrical, trachea midline and thyroid normal to inspection and palpation Lungs: clear to auscultation bilaterally Breasts: normal appearance, no masses or tenderness Heart: regular rate and rhythm Abdomen: soft, non-tender; bowel sounds normal; no masses,  no organomegaly Extremities: extremities normal, atraumatic, no cyanosis or edema Skin: Skin color, texture, turgor normal. No rashes or lesions Lymph nodes: Cervical, supraclavicular, and axillary nodes normal. No abnormal inguinal nodes palpated Neurologic: Grossly normal   Pelvic: External genitalia:  no lesions               Urethra:  normal appearing urethra with no masses, tenderness or lesions              Bartholins and Skenes: normal                 Vagina: normal appearing vagina with normal color and discharge, no lesions              Cervix: no lesions              Pap taken: Yes.   Bimanual Exam:  Uterus:  normal size, contour, position, consistency, mobility, non-tender              Adnexa: normal adnexa and no mass, fullness, tenderness               Rectovaginal: Confirms               Anus:  normal sphincter tone, no lesions  Chaperone, Terence Lux, CMA, was present for exam.  A:  Well Woman with normal exam PMP, no HRT H/o +HR HPV, colposcopy 02/02/17 Osteoporosis  Mildly elevated lipids Depression  P:   Mammogram guidelines reviewed.  Doing yearly. pap smear and HR HPV obtained today Supplements discussed today Colonoscopy UTD BMD order placed today to do with MMG in September Is going to finish her 300mg  XL Wellbutrin dosing and then change to 150mg  XL daily.  She will let me know how she is doing with this dosage before making any changes.   Return annually or prn

## 2019-02-14 NOTE — Patient Instructions (Signed)
1200-1500mg  calcium total (including dietary) intake a day.  You may want to check.  Each dairy serving is about 300mg .

## 2019-02-15 DIAGNOSIS — R2231 Localized swelling, mass and lump, right upper limb: Secondary | ICD-10-CM | POA: Diagnosis not present

## 2019-02-15 DIAGNOSIS — M65341 Trigger finger, right ring finger: Secondary | ICD-10-CM | POA: Diagnosis not present

## 2019-02-15 DIAGNOSIS — M65331 Trigger finger, right middle finger: Secondary | ICD-10-CM | POA: Diagnosis not present

## 2019-02-15 DIAGNOSIS — M720 Palmar fascial fibromatosis [Dupuytren]: Secondary | ICD-10-CM | POA: Diagnosis not present

## 2019-02-16 LAB — CYTOLOGY - PAP
Comment: NEGATIVE
Diagnosis: NEGATIVE
High risk HPV: POSITIVE — AB

## 2019-02-23 ENCOUNTER — Telehealth: Payer: Self-pay | Admitting: *Deleted

## 2019-02-23 NOTE — Telephone Encounter (Signed)
-----   Message from Megan Salon, MD sent at 02/21/2019 10:32 AM EST ----- Please let pt know her pap was normal and high risk HPV was still positive.  She has already seen these results.  The newest 2020 guidelines recommend that she has repeat testing including HR HPV in 12 months.  She does not need a colposcopy this year.  Her 5 year risk of high grade cervical dysplasia is 2.77%.  Needs appt in 12 months with new recall placed and current recall removed.  Routing to triage as pt often has lots of questions.  CC:  Terence Lux, CMA

## 2019-02-23 NOTE — Telephone Encounter (Signed)
Spoke to pt. Pt had lots of questions re: lab results from HR HPV.  Was on the phone for 30 mins.  Pt wanting to know more of why just repeating HR HPV in 12 months vs doing more if has possibility of cancer. Pt given Dr Ammie Ferrier recommendations and guidelines. Pt still not settled, but changed AEX made at front office from 04/2020 to 02/15/2020. Pt aware and agreeable. Pt also worried about how medicare will cover pap again in 1 year.  Pt advised to call medicare to check on benefits and possible out of pocket cost. Pt agreeable.   Routing to Dr Sabra Heck for any additional recommendations or advice.

## 2019-02-23 NOTE — Telephone Encounter (Signed)
I am happy to have a follow up visit with her to discuss the guidelines and recommendations as well as answer any questions.  Would be best in person, I think, and not web based.  Ok to schedule if she wants.

## 2019-02-23 NOTE — Telephone Encounter (Signed)
Left message for pt to call back to triage, RN

## 2019-02-23 NOTE — Telephone Encounter (Signed)
Burnice Logan, RN  02/23/2019 8:35 AM EST    Left message to call Sharee Pimple, RN at [facility].     Burnice Logan, RN  02/21/2019 2:46 PM EST    12 recall placed

## 2019-02-23 NOTE — Telephone Encounter (Signed)
Patient returning call to Jill. °

## 2019-02-24 NOTE — Telephone Encounter (Signed)
Spoke back with pt. Pt agreeable to OV for discussion of labs and guidelines. Scheduled OV 02/27/2019 at 1030 am. CPS negative.   Routing to Dr Sabra Heck for review and will close encounter.

## 2019-02-27 ENCOUNTER — Ambulatory Visit: Payer: Medicare HMO | Admitting: Obstetrics & Gynecology

## 2019-02-27 ENCOUNTER — Encounter: Payer: Self-pay | Admitting: Obstetrics & Gynecology

## 2019-02-27 ENCOUNTER — Other Ambulatory Visit: Payer: Self-pay

## 2019-02-27 VITALS — BP 132/70 | HR 76 | Temp 97.2°F | Resp 10 | Ht 59.25 in

## 2019-02-27 DIAGNOSIS — R69 Illness, unspecified: Secondary | ICD-10-CM | POA: Diagnosis not present

## 2019-02-27 DIAGNOSIS — B977 Papillomavirus as the cause of diseases classified elsewhere: Secondary | ICD-10-CM

## 2019-02-27 NOTE — Progress Notes (Signed)
GYNECOLOGY  VISIT  CC:   Discuss test results  HPI: 69 y.o. G48P3003 Widowed White or Caucasian female here for discussion of high risk HPV with most recent pap smear.  Pt has not been SA in >10 years.  She has struggled with this test result since first obtained in 2019 when I first saw her.  She'd never had abnormal pap smears prior to the ASCUS pap with +HR HPV in 2019 however she was never tested for HR HPV.  She is aware this guideline started in about 2013 so this testing was appropriate at the time.  She's subsequently now had two additional high risk HPV tests so this was not a false positive test in 2019.  She is aware it is impossible to know how long she's had high risk HPV.  We discussed transmission, regression rates, risks of dysplasia and other questions pertaining to high risk HPV that she had.  We have addressed many of these in the past but questions were all addressed.    GYNECOLOGIC HISTORY: Patient's last menstrual period was 01/26/2002 (approximate). Contraception: menopausal Menopausal hormone therapy: none  Patient Active Problem List   Diagnosis Date Noted  . Abnormal Papanicolaou smear of cervix with positive human papilloma virus (HPV) test 02/05/2017  . Hyperlipidemia 10/09/2015  . Personal history of colonic polyp-adenoma 04/26/2013  . S/P foot surgery 12/06/2012  . Depression 01/27/1999    Past Medical History:  Diagnosis Date  . Depression   . Hyperlipidemia     Past Surgical History:  Procedure Laterality Date  . BELPHAROPTOSIS REPAIR    . COLONOSCOPY    . FOOT SURGERY  01/15/2012   right  . FOOT SURGERY  July 2014   right  . KNEE ARTHROSCOPY Right    age 7  . LIPOSUCTION  5/05    MEDS:   Current Outpatient Medications on File Prior to Visit  Medication Sig Dispense Refill  . ALPRAZolam (XANAX) 0.5 MG tablet Take 0.5 mg by mouth at bedtime as needed for anxiety.    Marland Kitchen b complex vitamins tablet Take 1 tablet by mouth daily.    Marland Kitchen buPROPion  (WELLBUTRIN XL) 150 MG 24 hr tablet Take 1 tablet (150 mg total) by mouth daily. 30 tablet 2  . CALCIUM PO Take 1,200 mg by mouth daily.    . Cholecalciferol (D3-1000 PO) Take 1 tablet by mouth daily.    . fluticasone (FLONASE) 50 MCG/ACT nasal spray Place 2 sprays into both nostrils daily. 16 g 6  . FOLIC ACID PO Take by mouth.    . Multiple Vitamins-Minerals (ZINC PO) Take 15 mg by mouth daily.    . mupirocin ointment (BACTROBAN) 2 % APPLY TO NOSTRIL TWICE A DAY FOR 5 7 DAYS    . NON FORMULARY Protandim Synergizer - take one caplet by mouth daily    . Probiotic Product (PROBIOTIC PO) Take by mouth. Women's Probiotic - Garden of Eden     No current facility-administered medications on file prior to visit.    ALLERGIES: Acetaminophen and Clindamycin/lincomycin  Family History  Problem Relation Age of Onset  . Diabetes Father   . Hypertension Father   . Heart disease Father        CABG  . Cancer Mother 22       cancer unknown primary  . COPD Mother   . Breast cancer Paternal Aunt   . Colon cancer Neg Hx    SH:  Widowed, non smoker  Review of Systems  All other systems reviewed and are negative.   PHYSICAL EXAMINATION:    BP 132/70 (BP Location: Right Arm, Patient Position: Sitting, Cuff Size: Normal)   Pulse 76   Temp (!) 97.2 F (36.2 C) (Temporal)   Resp 10   Ht 4' 11.25" (1.505 m)   LMP 01/26/2002 (Approximate)   BMI 22.15 kg/m     General appearance: alert, cooperative and appears stated age No physical exam was performed  Assessment: High +HR HPV  Plan: Plan to repeat pap and high risk HPV 1 year   20 minutes in face to face discussion of above questions spent with pt.

## 2019-03-03 ENCOUNTER — Encounter: Payer: Self-pay | Admitting: Obstetrics & Gynecology

## 2019-03-03 ENCOUNTER — Telehealth: Payer: Self-pay | Admitting: Obstetrics & Gynecology

## 2019-03-03 NOTE — Telephone Encounter (Signed)
Routing MyChart message to Dr. Sabra Heck to review and advise.

## 2019-03-03 NOTE — Telephone Encounter (Signed)
Patient sent the following correspondence through [facility].  I have been taking MegaFood Balance B Complex. May I take extra  B12 vitamin along with B complex?

## 2019-03-09 ENCOUNTER — Encounter: Payer: Self-pay | Admitting: Obstetrics & Gynecology

## 2019-03-09 NOTE — Telephone Encounter (Signed)
Patient sent the following through MyChart to follow up on previous message.  Denise Bradley, Denise "[name]"  P [facility]  Phone Number: [phone]  Good Morning   I sent a message February 5 and have not received a response. May I take B12 with B complex or B12 alone? I would also like to take vitamin c. Thank you for your advice.   Denise Bradley

## 2019-03-09 NOTE — Telephone Encounter (Signed)
Routing to Dr Sabra Heck for recommendations and advice.

## 2019-03-10 DIAGNOSIS — M720 Palmar fascial fibromatosis [Dupuytren]: Secondary | ICD-10-CM | POA: Diagnosis not present

## 2019-03-10 DIAGNOSIS — R2231 Localized swelling, mass and lump, right upper limb: Secondary | ICD-10-CM | POA: Diagnosis not present

## 2019-03-10 DIAGNOSIS — M65341 Trigger finger, right ring finger: Secondary | ICD-10-CM | POA: Diagnosis not present

## 2019-03-10 DIAGNOSIS — M65331 Trigger finger, right middle finger: Secondary | ICD-10-CM | POA: Diagnosis not present

## 2019-03-15 NOTE — Telephone Encounter (Signed)
Spoke to pt. Pt was given recommendations from Dr Sabra Heck about Vit B12 and Vit B complex. Pt going to choose one to take along with other vitamin supplements she has been taking. Pt wanting to take Vit B12 or B Complex to rid herself of +HR HPV that she has had for 3 years. Reminded pt of guidelines for repeat +HR HPV in 12 months. Pt agreeable. Has AEX in 01/2020.  Routing to Dr Sabra Heck for review and will close encounter.

## 2019-03-15 NOTE — Telephone Encounter (Signed)
I'm sorry.  I felt like I answered this question for this pt given the day she was in the office.  B12 is cobalamin.  B12 is in a b complex.  Unless she has b12 deficiency, she doesn't need b complex and also b12.  She should pick one or the other--B 12 or B complex.

## 2019-03-16 NOTE — Telephone Encounter (Signed)
There is no evidence that B vitamins help with HR HPV clearance which I have also discussed with her.  We went over all of this when she had her last OV.  Ok to close encounter.

## 2019-03-20 DIAGNOSIS — M65331 Trigger finger, right middle finger: Secondary | ICD-10-CM | POA: Diagnosis not present

## 2019-03-20 DIAGNOSIS — S60451A Superficial foreign body of left index finger, initial encounter: Secondary | ICD-10-CM | POA: Diagnosis not present

## 2019-03-20 DIAGNOSIS — M720 Palmar fascial fibromatosis [Dupuytren]: Secondary | ICD-10-CM | POA: Diagnosis not present

## 2019-03-20 DIAGNOSIS — R2231 Localized swelling, mass and lump, right upper limb: Secondary | ICD-10-CM | POA: Diagnosis not present

## 2019-03-28 DIAGNOSIS — R69 Illness, unspecified: Secondary | ICD-10-CM | POA: Diagnosis not present

## 2019-04-14 DIAGNOSIS — R2231 Localized swelling, mass and lump, right upper limb: Secondary | ICD-10-CM | POA: Diagnosis not present

## 2019-04-14 DIAGNOSIS — M65331 Trigger finger, right middle finger: Secondary | ICD-10-CM | POA: Diagnosis not present

## 2019-04-14 DIAGNOSIS — M65332 Trigger finger, left middle finger: Secondary | ICD-10-CM | POA: Diagnosis not present

## 2019-04-14 DIAGNOSIS — M720 Palmar fascial fibromatosis [Dupuytren]: Secondary | ICD-10-CM | POA: Diagnosis not present

## 2019-05-12 ENCOUNTER — Ambulatory Visit: Payer: Medicare HMO | Admitting: Obstetrics & Gynecology

## 2019-05-29 DIAGNOSIS — M65332 Trigger finger, left middle finger: Secondary | ICD-10-CM | POA: Diagnosis not present

## 2019-06-21 DIAGNOSIS — M65332 Trigger finger, left middle finger: Secondary | ICD-10-CM | POA: Diagnosis not present

## 2019-06-30 DIAGNOSIS — Z0100 Encounter for examination of eyes and vision without abnormal findings: Secondary | ICD-10-CM | POA: Diagnosis not present

## 2019-07-24 ENCOUNTER — Other Ambulatory Visit: Payer: Self-pay | Admitting: Obstetrics & Gynecology

## 2019-07-24 DIAGNOSIS — Z1231 Encounter for screening mammogram for malignant neoplasm of breast: Secondary | ICD-10-CM

## 2019-07-24 DIAGNOSIS — M818 Other osteoporosis without current pathological fracture: Secondary | ICD-10-CM

## 2019-08-09 DIAGNOSIS — M65332 Trigger finger, left middle finger: Secondary | ICD-10-CM | POA: Diagnosis not present

## 2019-08-09 DIAGNOSIS — M720 Palmar fascial fibromatosis [Dupuytren]: Secondary | ICD-10-CM | POA: Diagnosis not present

## 2019-08-11 ENCOUNTER — Telehealth: Payer: Self-pay | Admitting: Adult Health

## 2019-08-11 ENCOUNTER — Other Ambulatory Visit: Payer: Self-pay | Admitting: Family Medicine

## 2019-08-11 MED ORDER — BUPROPION HCL ER (XL) 300 MG PO TB24: 300.0000 mg | ORAL_TABLET | Freq: Every day | ORAL | 1 refills | Status: DC

## 2019-08-11 NOTE — Telephone Encounter (Signed)
Pt called back and would like Wellbutrin XL 150 mg sent to [facility] [address]. Thanks

## 2019-08-11 NOTE — Telephone Encounter (Signed)
The patient called about a PA that her insurance said that they were waiting on [place] for a PA on buPROPion (WELLBUTRIN XL) 150 MG 24 hr tablet  The patient said that it is supposed to be 300 mg and not 150 mg.  She spoke with Levada Dy from the insurance   [phone] FAX  Case #: [id number]  She is out of this medication and would like for someone to contact her to update her on the PA.

## 2019-08-11 NOTE — Telephone Encounter (Signed)
Case completed and approved.  Pt notified.  Nothing further needed at this time.

## 2019-08-15 NOTE — Telephone Encounter (Signed)
Spoke to the pt.  She stated she does not need the 150 mg.  She has picked up the 300 mg.  She stated she was confused but everything is right.  No further assistance is needed at this time.

## 2019-09-01 ENCOUNTER — Telehealth: Payer: Self-pay | Admitting: Adult Health

## 2019-09-01 DIAGNOSIS — J309 Allergic rhinitis, unspecified: Secondary | ICD-10-CM

## 2019-09-01 MED ORDER — FLUTICASONE PROPIONATE 50 MCG/ACT NA SUSP: 2.0000 | Freq: Every day | NASAL | 1 refills | Status: DC

## 2019-09-01 NOTE — Telephone Encounter (Signed)
Sent to the pharmacy by e-scribe. 

## 2019-09-01 NOTE — Telephone Encounter (Signed)
Pt is calling in stating that she is out of Rx fluticasone (FLONASE) 50 MCG  [facility] [address]

## 2019-09-03 DIAGNOSIS — W57XXXA Bitten or stung by nonvenomous insect and other nonvenomous arthropods, initial encounter: Secondary | ICD-10-CM | POA: Diagnosis not present

## 2019-09-03 DIAGNOSIS — S50861A Insect bite (nonvenomous) of right forearm, initial encounter: Secondary | ICD-10-CM | POA: Diagnosis not present

## 2019-10-03 DIAGNOSIS — R69 Illness, unspecified: Secondary | ICD-10-CM | POA: Diagnosis not present

## 2019-10-20 ENCOUNTER — Other Ambulatory Visit: Payer: Self-pay

## 2019-10-20 ENCOUNTER — Ambulatory Visit
Admission: RE | Admit: 2019-10-20 | Discharge: 2019-10-20 | Disposition: A | Discharge status: Home / Self Care | Payer: Medicare HMO | Source: Ambulatory Visit | Attending: Obstetrics & Gynecology | Admitting: Obstetrics & Gynecology

## 2019-10-20 DIAGNOSIS — M810 Age-related osteoporosis without current pathological fracture: Secondary | ICD-10-CM | POA: Diagnosis not present

## 2019-10-20 DIAGNOSIS — Z780 Asymptomatic menopausal state: Secondary | ICD-10-CM | POA: Diagnosis not present

## 2019-10-20 DIAGNOSIS — Z1231 Encounter for screening mammogram for malignant neoplasm of breast: Secondary | ICD-10-CM | POA: Diagnosis not present

## 2019-10-20 DIAGNOSIS — M818 Other osteoporosis without current pathological fracture: Secondary | ICD-10-CM

## 2019-10-26 NOTE — Progress Notes (Signed)
Subjective:    34 yrs Widowed Caucasian G51P3003  female here to discuss recent BMD obtained 10/20/2019 showing osteoporosis in her spine with T score -2.8.     Osteoporosis Risk Factors  Nonmodifiable Personal Hx of fracture as an adult: no Hx of fracture in first-degree relative: no Caucasian race: yes Poor health/frailty: no  Potentially modifiable: Tobacco use: no Low body weight (<127 lbs): yes Estrogen deficiency  early menopause (age <45) or bilateral ovariectomy: no  prolonged premenopausal amenorrhea (>1 yr): no Low calcium intake (lifelong): no Alcohol use more than 2 drinks per day: no Recurrent falls: no Inadequate physical activity: yes  Current calcium and Vit D intake:  1300mg  and 1000 Vit D  Review of Systems A comprehensive review of systems was negative.     Objective:   PHYSICAL EXAM BP 130/80   Pulse 72   Resp 16   Wt 109 lb (49.4 kg)   LMP 01/26/2002 (Approximate)   BMI 21.83 kg/m  General appearance: alert and no distress  Imaging Bone Density: Spine T Score: -2.8, Hip T Score: -2.5.                                    Assessment:   Osteoporosis with T score -2.8   Plan:   1.  Patient counseled in adequate calcium and vitamin D exposure.  Calcium - 500 - 1000 mg elemental calcium/day in divided doses  Vitamin D - 800 IU/day 2.  Weight bearing exercise recommended at least 30 minutes 3 times per week.  3.  Recommendation to avoid heavy ETOH use.  4.  Repeat BMD in 2 years 5.  Pt declines medications 6.  Fall prevention discussed.   34 minutes spent with pt discussing results, additional evaluation, treatment options, follow up.

## 2019-10-31 ENCOUNTER — Ambulatory Visit (INDEPENDENT_AMBULATORY_CARE_PROVIDER_SITE_OTHER): Payer: Medicare HMO | Admitting: Obstetrics & Gynecology

## 2019-10-31 ENCOUNTER — Other Ambulatory Visit: Payer: Self-pay

## 2019-10-31 ENCOUNTER — Encounter: Payer: Self-pay | Admitting: Obstetrics & Gynecology

## 2019-10-31 VITALS — BP 130/80 | HR 72 | Resp 16 | Wt 109.0 lb

## 2019-10-31 DIAGNOSIS — M810 Age-related osteoporosis without current pathological fracture: Secondary | ICD-10-CM

## 2019-11-01 LAB — PTH, INTACT AND CALCIUM
Calcium: 10.2 mg/dL (ref 8.7–10.3)
PTH: 27 pg/mL (ref 15–65)

## 2019-11-01 LAB — VITAMIN D 25 HYDROXY (VIT D DEFICIENCY, FRACTURES): Vit D, 25-Hydroxy: 103 ng/mL — ABNORMAL HIGH (ref 30.0–100.0)

## 2019-11-01 LAB — TSH: TSH: 2.08 u[IU]/mL (ref 0.450–4.500)

## 2019-11-01 LAB — T4, FREE: Free T4: 1.24 ng/dL (ref 0.82–1.77)

## 2019-11-01 LAB — T3, FREE: T3, Free: 2.6 pg/mL (ref 2.0–4.4)

## 2019-11-06 ENCOUNTER — Telehealth: Payer: Self-pay

## 2019-11-06 NOTE — Telephone Encounter (Signed)
Patient notified of results as written by provider 

## 2019-11-06 NOTE — Telephone Encounter (Signed)
Left message to call back  

## 2019-11-06 NOTE — Telephone Encounter (Signed)
Left message for call back.

## 2019-11-06 NOTE — Telephone Encounter (Signed)
Patient is returning call.  °

## 2019-11-06 NOTE — Telephone Encounter (Signed)
-----   Message from Megan Salon, MD sent at 11/05/2019 10:41 PM EDT ----- Please let pt know her thyroid testing, PTH and calcium were normal.  Her Vit D is 103.  This is a little high.  Should be less than 100.  Can you please ask her to get a complete amount of Vit D she takes every day and then I can tell her how much to decrease each day?  Thanks.

## 2019-12-13 ENCOUNTER — Other Ambulatory Visit: Payer: Self-pay

## 2019-12-13 ENCOUNTER — Ambulatory Visit: Payer: Medicare HMO | Admitting: Dermatology

## 2019-12-13 ENCOUNTER — Encounter: Payer: Self-pay | Admitting: Dermatology

## 2019-12-13 DIAGNOSIS — L820 Inflamed seborrheic keratosis: Secondary | ICD-10-CM | POA: Diagnosis not present

## 2019-12-13 DIAGNOSIS — L821 Other seborrheic keratosis: Secondary | ICD-10-CM | POA: Diagnosis not present

## 2019-12-13 DIAGNOSIS — L814 Other melanin hyperpigmentation: Secondary | ICD-10-CM | POA: Diagnosis not present

## 2019-12-13 DIAGNOSIS — Z1283 Encounter for screening for malignant neoplasm of skin: Secondary | ICD-10-CM

## 2019-12-13 DIAGNOSIS — D1801 Hemangioma of skin and subcutaneous tissue: Secondary | ICD-10-CM

## 2019-12-14 DIAGNOSIS — H5213 Myopia, bilateral: Secondary | ICD-10-CM | POA: Diagnosis not present

## 2019-12-14 DIAGNOSIS — Z0100 Encounter for examination of eyes and vision without abnormal findings: Secondary | ICD-10-CM | POA: Diagnosis not present

## 2019-12-14 DIAGNOSIS — H2513 Age-related nuclear cataract, bilateral: Secondary | ICD-10-CM | POA: Diagnosis not present

## 2019-12-17 ENCOUNTER — Encounter: Payer: Self-pay | Admitting: Dermatology

## 2019-12-17 NOTE — Progress Notes (Addendum)
   Follow-Up Visit   Subjective  Denise Bradley is a 69 y.o. female who presents for the following: New Patient (Initial Visit) (Patient here today for skin check. Check spot on nose x 2 years no bleeding).  Check several spots Location:  Duration:  Quality:  Associated Signs/Symptoms: Modifying Factors:  Severity:  Timing: Context:   Objective  Well appearing patient in no apparent distress; mood and affect are within normal limits.  All sun exposed areas plus back examined.  Plus arms, legs, upper chest.   Assessment & Plan    Skin exam for malignant neoplasm Chest - [facility])  Yearly skin check  Hemangioma of skin (2) Right Abdomen (side) - Upper; Mid Back  Benign okay to leave.  Solar lentigo (2) Neck - Anterior; Right Nasal Sidewall  Benign okay to leave unless patient wants something done. There are three options (if patient wants something done to the these spots): Leave be, topical skin lighteners, or procedures including freezing, peel, and laser.   Patient does request treatment so we will start with liquid nitrogen freeze.  Destruction of lesion - Neck - Anterior Complexity: simple   Destruction method: cryotherapy   Informed consent: discussed and consent obtained   Timeout:  patient name, date of birth, surgical site, and procedure verified Lesion destroyed using liquid nitrogen: Yes   Cryotherapy cycles:  5 Outcome: patient tolerated procedure well with no complications   Post-procedure details: wound care instructions given    Inflamed seborrheic keratosis Right Breast  Benign okay to leave if stable     I, Lavonna Monarch, MD, have reviewed all documentation for this visit.  The documentation on 01/01/20 for the exam, diagnosis, procedures, and orders are all accurate and complete.

## 2020-01-02 DIAGNOSIS — R69 Illness, unspecified: Secondary | ICD-10-CM | POA: Diagnosis not present

## 2020-02-15 ENCOUNTER — Ambulatory Visit: Payer: Medicare HMO

## 2020-02-19 ENCOUNTER — Other Ambulatory Visit: Payer: Self-pay | Admitting: Adult Health

## 2020-02-20 ENCOUNTER — Telehealth: Payer: Medicare HMO | Admitting: Adult Health

## 2020-02-20 ENCOUNTER — Telehealth: Payer: Self-pay | Admitting: Adult Health

## 2020-02-20 DIAGNOSIS — Z20822 Contact with and (suspected) exposure to covid-19: Secondary | ICD-10-CM | POA: Diagnosis not present

## 2020-02-20 DIAGNOSIS — U071 COVID-19: Secondary | ICD-10-CM | POA: Diagnosis not present

## 2020-02-20 NOTE — Telephone Encounter (Signed)
Patient canceled the virtual that she had scheduled with Tommi Rumps and would like to to have [facility] or his nurse to call her today to discuss some lab results that she has.  Please advise

## 2020-02-20 NOTE — Telephone Encounter (Signed)
Spoke to the pt.  She tested positive today for Covid.  She went to a UC and was tested.  She had some diarrhea and fatigue a few days ago.  Currently has no symptoms.  Advised her to quarantine for 5 days.  Ok to go out in public as long as she has not had a fever for 24 hours.  Will forward to [facility] as [place].

## 2020-02-20 NOTE — Telephone Encounter (Signed)
Sent to the pharmacy by e-scribe.  Pt is scheduled for cpx on 03/19/20.  Nothing further needed.

## 2020-03-19 ENCOUNTER — Other Ambulatory Visit: Payer: Self-pay

## 2020-03-19 ENCOUNTER — Encounter: Payer: Self-pay | Admitting: Adult Health

## 2020-03-19 ENCOUNTER — Ambulatory Visit (INDEPENDENT_AMBULATORY_CARE_PROVIDER_SITE_OTHER): Payer: Medicare HMO | Admitting: Adult Health

## 2020-03-19 VITALS — BP 138/90 | Temp 98.0°F | Ht 59.25 in | Wt 104.0 lb

## 2020-03-19 DIAGNOSIS — R69 Illness, unspecified: Secondary | ICD-10-CM | POA: Diagnosis not present

## 2020-03-19 DIAGNOSIS — E559 Vitamin D deficiency, unspecified: Secondary | ICD-10-CM

## 2020-03-19 DIAGNOSIS — F32A Depression, unspecified: Secondary | ICD-10-CM | POA: Diagnosis not present

## 2020-03-19 DIAGNOSIS — E782 Mixed hyperlipidemia: Secondary | ICD-10-CM

## 2020-03-19 DIAGNOSIS — Z0000 Encounter for general adult medical examination without abnormal findings: Secondary | ICD-10-CM | POA: Diagnosis not present

## 2020-03-19 LAB — CBC WITH DIFFERENTIAL/PLATELET
Basophils Absolute: 0 10*3/uL (ref 0.0–0.1)
Basophils Relative: 0.7 % (ref 0.0–3.0)
Eosinophils Absolute: 0.1 10*3/uL (ref 0.0–0.7)
Eosinophils Relative: 1.7 % (ref 0.0–5.0)
HCT: 40.8 % (ref 36.0–46.0)
Hemoglobin: 13.4 g/dL (ref 12.0–15.0)
Lymphocytes Relative: 36.1 % (ref 12.0–46.0)
Lymphs Abs: 2.1 10*3/uL (ref 0.7–4.0)
MCHC: 32.8 g/dL (ref 30.0–36.0)
MCV: 88.3 fl (ref 78.0–100.0)
Monocytes Absolute: 0.6 10*3/uL (ref 0.1–1.0)
Monocytes Relative: 9.5 % (ref 3.0–12.0)
Neutro Abs: 3.1 10*3/uL (ref 1.4–7.7)
Neutrophils Relative %: 52 % (ref 43.0–77.0)
Platelets: 172 10*3/uL (ref 150.0–400.0)
RBC: 4.62 Mil/uL (ref 3.87–5.11)
RDW: 14.1 % (ref 11.5–15.5)
WBC: 5.9 10*3/uL (ref 4.0–10.5)

## 2020-03-19 LAB — LIPID PANEL
Cholesterol: 214 mg/dL — ABNORMAL HIGH (ref 0–200)
HDL: 78.6 mg/dL (ref >39.00)
LDL Cholesterol: 117 mg/dL — ABNORMAL HIGH (ref 0–99)
NonHDL: 135.65
Total CHOL/HDL Ratio: 3
Triglycerides: 95 mg/dL (ref 0.0–149.0)
VLDL: 19 mg/dL (ref 0.0–40.0)

## 2020-03-19 LAB — COMPREHENSIVE METABOLIC PANEL
ALT: 13 U/L (ref 0–35)
AST: 17 U/L (ref 0–37)
Albumin: 4.5 g/dL (ref 3.5–5.2)
Alkaline Phosphatase: 60 U/L (ref 39–117)
BUN: 10 mg/dL (ref 6–23)
CO2: 29 mEq/L (ref 19–32)
Calcium: 9.7 mg/dL (ref 8.4–10.5)
Chloride: 102 mEq/L (ref 96–112)
Creatinine, Ser: 1.02 mg/dL (ref 0.40–1.20)
GFR: 56.69 mL/min — ABNORMAL LOW (ref >60.00)
Glucose, Bld: 85 mg/dL (ref 70–99)
Potassium: 4.7 mEq/L (ref 3.5–5.1)
Sodium: 140 mEq/L (ref 135–145)
Total Bilirubin: 0.8 mg/dL (ref 0.2–1.2)
Total Protein: 6.9 g/dL (ref 6.0–8.3)

## 2020-03-19 LAB — VITAMIN D 25 HYDROXY (VIT D DEFICIENCY, FRACTURES): VITD: 103.03 ng/mL (ref 30.00–100.00)

## 2020-03-19 LAB — TSH: TSH: 2.88 u[IU]/mL (ref 0.35–4.50)

## 2020-03-19 MED ORDER — ALPRAZOLAM 0.5 MG PO TABS: 0.5000 mg | ORAL_TABLET | Freq: Every evening | ORAL | 0 refills | Status: DC | PRN

## 2020-03-19 NOTE — Progress Notes (Signed)
Subjective:    Patient ID: Denise Bradley, female    DOB: May 10, 1950, 70 y.o.   MRN: [id number]  HPI Patient presents for yearly preventative medicine examination. He is a pleasant 70 year old female who  has a past medical history of Atypical mole (03/14/2004), Atypical mole (03/01/2013), Atypical mole (03/01/2013), Depression, and Hyperlipidemia.   Depression/Anxiety -well controlled with Wellbutrin 300 mg XR.  She denies depressive symptoms. Takes Xanax PRN for sleep. Last refill was in 2019  Hyperlipidemia - not currently on cholesterol reducing therapy.  She continues to work on lifestyle modifications.  The 10-year ASCVD risk score Mikey Bussing DC Brooke Bonito., et al., 2013) is: 8%   Values used to calculate the score:     Age: 53 years     Sex: Female     Is Non-Hispanic African American: No     Diabetic: No     Tobacco smoker: No     Systolic Blood Pressure: 517 mmHg     Is BP treated: No     HDL Cholesterol: 83.2 mg/dL     Total Cholesterol: 208 mg/dL   Vitamin D - when she was checked in October by GYN her Vitamin D level was elevated. She would like to recheck this level today.   All immunizations and health maintenance protocols were reviewed with the patient and needed orders were placed. She   Appropriate screening laboratory values were ordered for the patient including screening of hyperlipidemia, renal function and hepatic function.  Medication reconciliation,  past medical history, social history, problem list and allergies were reviewed in detail with the patient  Goals were established with regard to weight loss, exercise, and  diet in compliance with medications. She does eat healthy and exercises on a routine basis.  Wt Readings from Last 3 Encounters:  03/19/20 104 lb (47.2 kg)  10/31/19 109 lb (49.4 kg)  02/14/19 110 lb 9.6 oz (50.2 kg)   She is up-to-date on routine colon cancer screening, mammograms, bone density screening, and GYN care   Review of Systems   Constitutional: Negative.   HENT: Negative.   Eyes: Negative.   Respiratory: Negative.   Cardiovascular: Negative.   Gastrointestinal: Negative.   Endocrine: Negative.   Genitourinary: Negative.   Musculoskeletal: Negative.   Skin: Negative.   Allergic/Immunologic: Negative.   Neurological: Negative.   Hematological: Negative.   Psychiatric/Behavioral: Negative.    Past Medical History:  Diagnosis Date  . Atypical mole 03/14/2004   Mid Upper Buttocks (slight)  . Atypical mole 03/01/2013   Right Shoulder (mild)  . Atypical mole 03/01/2013   Left Back (mild)  . Depression   . Hyperlipidemia     Social History   Socioeconomic History  . Marital status: Widowed    Spouse name: Not on file  . Number of children: Not on file  . Years of education: Not on file  . Highest education level: Not on file  Occupational History  . Not on file  Tobacco Use  . Smoking status: Never Smoker  . Smokeless tobacco: Never Used  Vaping Use  . Vaping Use: Never used  Substance and Sexual Activity  . Alcohol use: Yes    Alcohol/week: 7.0 standard drinks    Types: 7 Glasses of wine per week  . Drug use: No  . Sexual activity: Not Currently    Birth control/protection: Post-menopausal  Other Topics Concern  . Not on file  Social History Narrative   Retired - Worked with [place] and  last May she was laid off. She worked there for 22 years   Going back to school for [employer].    No pets   Three children ( [place], [place]. [place] ( Son and two daughters- all married. & grandchildren)   States " Does not do anything fun".          Diet: Eat healthy, does not eat fast food.    Exercise: Does not exercise, does not feel like she has time.       Social Determinants of Health   Financial Resource Strain: Not on file  Food Insecurity: Not on file  Transportation Needs: Not on file  Physical Activity: Not on file  Stress: Not on file  Social Connections: Not on file   Intimate Partner Violence: Not on file    Past Surgical History:  Procedure Laterality Date  . BELPHAROPTOSIS REPAIR    . COLONOSCOPY    . FOOT SURGERY  01/15/2012   right  . FOOT SURGERY  July 2014   right  . KNEE ARTHROSCOPY Right    age 18  . LIPOSUCTION  5/05    Family History  Problem Relation Age of Onset  . Diabetes Father   . Hypertension Father   . Heart disease Father        CABG  . Cancer Mother 52       cancer unknown primary  . COPD Mother   . Breast cancer Paternal Aunt   . Colon cancer Neg Hx     Allergies  Allergen Reactions  . Acetaminophen Anaphylaxis    REACTION: swells throat shut/can't breathe  . Clindamycin/Lincomycin Other (See Comments)    Cause Loose Stools// Diarrhea    Current Outpatient Medications on File Prior to Visit  Medication Sig Dispense Refill  . Ascorbic Acid (VITAMIN C PO) Take 1,000 mg by mouth.    Marland Kitchen buPROPion (WELLBUTRIN XL) 300 MG 24 hr tablet TAKE 1 TABLET BY MOUTH EVERY DAY 90 tablet 0  . CALCIUM-VITAMIN D PO Take by mouth. Calcium 600, Vit D 1000    . Cyanocobalamin (B-12 PO) Take 1,000 mcg by mouth.    . fluticasone (FLONASE) 50 MCG/ACT nasal spray Place 2 sprays into both nostrils daily. 48 g 1  . FOLIC ACID PO Take 350 mg by mouth.     Marland Kitchen LORazepam (ATIVAN) 1 MG tablet lorazepam 1 mg tablet    . MAGNESIUM PO Take 400 mg by mouth daily. Glycinate complex    . Multiple Vitamins-Minerals (ZINC PO) Take 15 mg by mouth daily.    . NON FORMULARY Protandim Synergizer - take one caplet by mouth daily    . OVER THE COUNTER MEDICATION Natural D-Hist    . Probiotic Product (PROBIOTIC PO) Take by mouth. Women's Probiotic - [place] of [place]     No current facility-administered medications on file prior to visit.    BP 138/90   Temp 98 F (36.7 C)   Ht 4' 11.25" (1.505 m) Comment: WITHOUT SHOES  Wt 104 lb (47.2 kg)   LMP 01/26/2002 (Approximate)   BMI 20.83 kg/m       Objective:   Physical Exam Vitals and nursing  note reviewed.  Constitutional:      General: She is not in acute distress.    Appearance: Normal appearance. She is well-developed. She is not ill-appearing.  HENT:     Head: Normocephalic and atraumatic.     Right Ear: Tympanic membrane, ear canal and external ear normal.  There is no impacted cerumen.     Left Ear: Tympanic membrane, ear canal and external ear normal. There is no impacted cerumen.     Nose: Nose normal. No congestion or rhinorrhea.     Mouth/Throat:     Mouth: Mucous membranes are moist.     Pharynx: Oropharynx is clear. No oropharyngeal exudate or posterior oropharyngeal erythema.  Eyes:     General:        Right eye: No discharge.        Left eye: No discharge.     Extraocular Movements: Extraocular movements intact.     Conjunctiva/sclera: Conjunctivae normal.     Pupils: Pupils are equal, round, and reactive to light.  Neck:     Thyroid: No thyromegaly.     Vascular: No carotid bruit.     Trachea: No tracheal deviation.  Cardiovascular:     Rate and Rhythm: Normal rate and regular rhythm.     Pulses: Normal pulses.     Heart sounds: Normal heart sounds. No murmur heard. No friction rub. No gallop.   Pulmonary:     Effort: Pulmonary effort is normal. No respiratory distress.     Breath sounds: Normal breath sounds. No stridor. No wheezing, rhonchi or rales.  Chest:     Chest wall: No tenderness.  Abdominal:     General: Abdomen is flat. Bowel sounds are normal. There is no distension.     Palpations: Abdomen is soft. There is no mass.     Tenderness: There is no abdominal tenderness. There is no right CVA tenderness, left CVA tenderness, guarding or rebound.     Hernia: No hernia is present.  Musculoskeletal:        General: No swelling, tenderness, deformity or signs of injury. Normal range of motion.     Cervical back: Normal range of motion and neck supple.     Right lower leg: No edema.     Left lower leg: No edema.  Lymphadenopathy:     Cervical:  No cervical adenopathy.  Skin:    General: Skin is warm and dry.     Coloration: Skin is not jaundiced or pale.     Findings: No bruising, erythema, lesion or rash.  Neurological:     General: No focal deficit present.     Mental Status: She is alert and oriented to person, place, and time.     Cranial Nerves: No cranial nerve deficit.     Sensory: No sensory deficit.     Motor: No weakness.     Coordination: Coordination normal.     Gait: Gait normal.     Deep Tendon Reflexes: Reflexes normal.  Psychiatric:        Mood and Affect: Mood normal.        Behavior: Behavior normal.        Thought Content: Thought content normal.        Judgment: Judgment normal.       Assessment & Plan:  1. Routine general medical examination at a health care facility - Continue to exercise and eat healthy  - Follow up in one year or sooner if needed - TSH; Future - Lipid panel; Future - Comprehensive metabolic panel; Future - CBC with Differential/Platelet; Future - CBC with Differential/Platelet - Comprehensive metabolic panel - Lipid panel - TSH  2. Mixed hyperlipidemia - Consider statin  - TSH; Future - Lipid panel; Future - Comprehensive metabolic panel; Future - CBC with Differential/Platelet; Future - CBC  with Differential/Platelet - Comprehensive metabolic panel - Lipid panel - TSH  3. Depression, unspecified depression type - Continue with Wellbutrin and xanax PRN  - ALPRAZolam (XANAX) 0.5 MG tablet; Take 1 tablet (0.5 mg total) by mouth at bedtime as needed for anxiety.  Dispense: 15 tablet; Refill: 0  4. Vitamin D deficiency  - VITAMIN D 25 Hydroxy (Vit-D Deficiency, Fractures); Future - VITAMIN D 25 Hydroxy (Vit-D Deficiency, Fractures)   Dorothyann Peng, NP

## 2020-03-21 ENCOUNTER — Ambulatory Visit (HOSPITAL_BASED_OUTPATIENT_CLINIC_OR_DEPARTMENT_OTHER): Payer: Medicare HMO | Admitting: Obstetrics & Gynecology

## 2020-03-21 ENCOUNTER — Other Ambulatory Visit: Payer: Self-pay | Admitting: Family Medicine

## 2020-03-21 DIAGNOSIS — R7989 Other specified abnormal findings of blood chemistry: Secondary | ICD-10-CM

## 2020-03-21 DIAGNOSIS — E782 Mixed hyperlipidemia: Secondary | ICD-10-CM

## 2020-04-16 DIAGNOSIS — Z0100 Encounter for examination of eyes and vision without abnormal findings: Secondary | ICD-10-CM | POA: Diagnosis not present

## 2020-04-19 DIAGNOSIS — Z20822 Contact with and (suspected) exposure to covid-19: Secondary | ICD-10-CM | POA: Diagnosis not present

## 2020-05-03 ENCOUNTER — Ambulatory Visit: Payer: Medicare HMO | Admitting: Obstetrics & Gynecology

## 2020-05-07 ENCOUNTER — Ambulatory Visit (INDEPENDENT_AMBULATORY_CARE_PROVIDER_SITE_OTHER): Payer: Medicare HMO | Admitting: Obstetrics & Gynecology

## 2020-05-07 ENCOUNTER — Encounter (HOSPITAL_BASED_OUTPATIENT_CLINIC_OR_DEPARTMENT_OTHER): Payer: Self-pay | Admitting: Obstetrics & Gynecology

## 2020-05-07 ENCOUNTER — Other Ambulatory Visit (HOSPITAL_COMMUNITY)
Admission: RE | Admit: 2020-05-07 | Discharge: 2020-05-07 | Disposition: A | Discharge status: Home / Self Care | Payer: Medicare HMO | Source: Ambulatory Visit | Attending: Obstetrics & Gynecology | Admitting: Obstetrics & Gynecology

## 2020-05-07 ENCOUNTER — Other Ambulatory Visit: Payer: Self-pay

## 2020-05-07 VITALS — BP 130/80 | HR 82 | Ht 59.25 in | Wt 102.8 lb

## 2020-05-07 DIAGNOSIS — Z01419 Encounter for gynecological examination (general) (routine) without abnormal findings: Secondary | ICD-10-CM | POA: Diagnosis not present

## 2020-05-07 DIAGNOSIS — Z9189 Other specified personal risk factors, not elsewhere classified: Secondary | ICD-10-CM

## 2020-05-07 DIAGNOSIS — Z01411 Encounter for gynecological examination (general) (routine) with abnormal findings: Secondary | ICD-10-CM | POA: Diagnosis present

## 2020-05-07 DIAGNOSIS — Z124 Encounter for screening for malignant neoplasm of cervix: Secondary | ICD-10-CM | POA: Diagnosis present

## 2020-05-07 DIAGNOSIS — M810 Age-related osteoporosis without current pathological fracture: Secondary | ICD-10-CM | POA: Diagnosis not present

## 2020-05-07 DIAGNOSIS — Z86010 Personal history of colonic polyps: Secondary | ICD-10-CM | POA: Diagnosis not present

## 2020-05-07 DIAGNOSIS — Z1151 Encounter for screening for human papillomavirus (HPV): Secondary | ICD-10-CM | POA: Diagnosis present

## 2020-05-07 DIAGNOSIS — B977 Papillomavirus as the cause of diseases classified elsewhere: Secondary | ICD-10-CM | POA: Insufficient documentation

## 2020-05-07 DIAGNOSIS — R69 Illness, unspecified: Secondary | ICD-10-CM | POA: Diagnosis not present

## 2020-05-07 DIAGNOSIS — R87810 Cervical high risk human papillomavirus (HPV) DNA test positive: Secondary | ICD-10-CM | POA: Diagnosis not present

## 2020-05-07 DIAGNOSIS — E785 Hyperlipidemia, unspecified: Secondary | ICD-10-CM | POA: Diagnosis not present

## 2020-05-07 DIAGNOSIS — R87618 Other abnormal cytological findings on specimens from cervix uteri: Secondary | ICD-10-CM

## 2020-05-07 NOTE — Progress Notes (Signed)
70 y.o. G44P3003 Widowed White or Caucasian female here for breast and pelvic exam.  I am also following her for h/o +HR HPV and osteoporosis.  She had her last BMD 09/2019.  She has osteoporosis but does not desire treatment.  VIt D level was 103.  She's stopped this and her calcium.    Denies vaginal bleeding.  Her cholesterol was elevated with her last test.  She is concerned about this.  ACC/AHA risk calculator done with pt today.  7.1% 10 year risk present.    We reviewed her pap history and current guidelines. She always has questions so these were addressed today.  Patient's last menstrual period was 01/26/2002 (approximate).          Sexually active: No.  H/O STD:  yes  Health Maintenance: PCP:  Beaulah Dinning.  Last wellness appt was 02/2020.  Did blood work at that appt:  yes Vaccines are up to date:  Yes except for not having Covid vaccination Colonoscopy:  2015, Dr Carlean Purl.  Follow up 5 years MMG:  09/2019 BMD:  09/2019 Last pap smear:  yes.     reports that she has never smoked. She has never used smokeless tobacco. She reports current alcohol use of about 7.0 standard drinks of alcohol per week. She reports that she does not use drugs.  Past Medical History:  Diagnosis Date  . Atypical mole 03/14/2004   Mid Upper Buttocks (slight)  . Atypical mole 03/01/2013   Right Shoulder (mild)  . Atypical mole 03/01/2013   Left Back (mild)  . Depression   . Hyperlipidemia     Past Surgical History:  Procedure Laterality Date  . BELPHAROPTOSIS REPAIR    . COLONOSCOPY    . FOOT SURGERY  01/15/2012   right  . FOOT SURGERY  July 2014   right  . KNEE ARTHROSCOPY Right    age 23  . LIPOSUCTION  5/05    Current Outpatient Medications  Medication Sig Dispense Refill  . ALPRAZolam (XANAX) 0.5 MG tablet Take 1 tablet (0.5 mg total) by mouth at bedtime as needed for anxiety. 15 tablet 0  . buPROPion (WELLBUTRIN XL) 300 MG 24 hr tablet TAKE 1 TABLET BY MOUTH EVERY DAY 90 tablet 0   . Cyanocobalamin (B-12 PO) Take 1,000 mcg by mouth.    . fluticasone (FLONASE) 50 MCG/ACT nasal spray Place 2 sprays into both nostrils daily. 48 g 1  . FOLIC ACID PO Take 127 mg by mouth.     Marland Kitchen LORazepam (ATIVAN) 1 MG tablet lorazepam 1 mg tablet    . MAGNESIUM PO Take 200 mg by mouth daily. Glycinate complex    . NON FORMULARY Protandim Synergizer - take one caplet by mouth daily    . OVER THE COUNTER MEDICATION Natural D-Hist    . Probiotic Product (PROBIOTIC PO) Take by mouth. Women's Probiotic - [place] of Eden     No current facility-administered medications for this visit.    Family History  Problem Relation Age of Onset  . Diabetes Father   . Hypertension Father   . Heart disease Father        CABG  . Cancer Mother 43       cancer unknown primary  . COPD Mother   . Breast cancer Paternal Aunt   . Colon cancer Neg Hx     Review of Systems  Gastrointestinal: Negative.   Genitourinary: Negative.   Psychiatric/Behavioral: Negative.   All other systems reviewed and are negative.  Exam:   BP 130/80   Pulse 82   Ht 4' 11.25" (1.505 m)   Wt 102 lb 12.8 oz (46.6 kg)   LMP 01/26/2002 (Approximate)   BMI 20.59 kg/m   Height: 4' 11.25" (150.5 cm)  General appearance: alert, cooperative and appears stated age Breasts: normal appearance, no masses or tenderness Abdomen: soft, non-tender; bowel sounds normal; no masses,  no organomegaly Lymph nodes: Cervical, supraclavicular, and axillary nodes normal.  No abnormal inguinal nodes palpated Neurologic: Grossly normal  Pelvic: External genitalia:  no lesions              Urethra:  normal appearing urethra with no masses, tenderness or lesions              Bartholins and Skenes: normal                 Vagina: normal appearing vagina with atrophic changes and no discharge, no lesions              Cervix: no lesions              Pap taken: Yes.   Bimanual Exam:  Uterus:  normal size, contour, position, consistency, mobility,  non-tender              Adnexa: normal adnexa and no mass, fullness, tenderness               Rectovaginal: Confirms               Anus:  normal sphincter tone, no lesions  Chaperone, Shela Nevin, CMA, was present for exam.  Assessment/Plan: 1. GYN exam for high-risk Medicare patient - pap and HR HPV obtained today with reflex 16/18/45 if HR HPV is positive - MMG 09/2019 - BMD 09/2019 - colonoscopy 2015, h/o adenomatous polyp.  Follow up 10 years. - Cory Nafzinger follows other health issues/lab work  2. High risk HPV infection - Cytology - PAP( [place])  3. Abnormal Papanicolaou smear of cervix with positive human papilloma virus (HPV) test  4. Age-related osteoporosis without current pathological fracture - declined treatment at this time.  Vit D was elevated so off of this.  Follow up lab planned with [facility]  D/w pt calcium only supplementation.  Dosage discussed.  Pt gets about one calcium serving a day so needs to supplement at least 1000mg  calcium alone.  5. Hyperlipidemia, unspecified hyperlipidemia type  6. History of colonic polyps  Total time with pt including discussion of lipid and management, abnormal pap smear hx, follow up, and when treatment due 25 minutes

## 2020-05-08 DIAGNOSIS — M810 Age-related osteoporosis without current pathological fracture: Secondary | ICD-10-CM | POA: Insufficient documentation

## 2020-05-08 DIAGNOSIS — B977 Papillomavirus as the cause of diseases classified elsewhere: Secondary | ICD-10-CM | POA: Insufficient documentation

## 2020-05-14 LAB — CYTOLOGY - PAP
Comment: NEGATIVE
Comment: NEGATIVE
Diagnosis: NEGATIVE
HPV 16: POSITIVE — AB
HPV 18 / 45: NEGATIVE
High risk HPV: POSITIVE — AB

## 2020-05-20 ENCOUNTER — Other Ambulatory Visit: Payer: Self-pay | Admitting: Adult Health

## 2020-05-29 DIAGNOSIS — M720 Palmar fascial fibromatosis [Dupuytren]: Secondary | ICD-10-CM | POA: Diagnosis not present

## 2020-05-29 DIAGNOSIS — M65332 Trigger finger, left middle finger: Secondary | ICD-10-CM | POA: Diagnosis not present

## 2020-06-18 ENCOUNTER — Other Ambulatory Visit (INDEPENDENT_AMBULATORY_CARE_PROVIDER_SITE_OTHER): Payer: Medicare HMO

## 2020-06-18 ENCOUNTER — Other Ambulatory Visit: Payer: Self-pay

## 2020-06-18 DIAGNOSIS — R7989 Other specified abnormal findings of blood chemistry: Secondary | ICD-10-CM

## 2020-06-18 DIAGNOSIS — E782 Mixed hyperlipidemia: Secondary | ICD-10-CM | POA: Diagnosis not present

## 2020-06-18 LAB — VITAMIN D 25 HYDROXY (VIT D DEFICIENCY, FRACTURES): VITD: 89.78 ng/mL (ref 30.00–100.00)

## 2020-06-18 LAB — LIPID PANEL
Cholesterol: 219 mg/dL — ABNORMAL HIGH (ref 0–200)
HDL: 84.1 mg/dL (ref >39.00)
LDL Cholesterol: 110 mg/dL — ABNORMAL HIGH (ref 0–99)
NonHDL: 135.02
Total CHOL/HDL Ratio: 3
Triglycerides: 126 mg/dL (ref 0.0–149.0)
VLDL: 25.2 mg/dL (ref 0.0–40.0)

## 2020-08-15 ENCOUNTER — Other Ambulatory Visit: Payer: Self-pay | Admitting: Adult Health

## 2020-10-14 DIAGNOSIS — M65332 Trigger finger, left middle finger: Secondary | ICD-10-CM | POA: Diagnosis not present

## 2020-10-23 ENCOUNTER — Other Ambulatory Visit: Payer: Self-pay | Admitting: Adult Health

## 2020-10-23 DIAGNOSIS — Z1231 Encounter for screening mammogram for malignant neoplasm of breast: Secondary | ICD-10-CM

## 2020-11-11 ENCOUNTER — Other Ambulatory Visit: Payer: Self-pay | Admitting: Adult Health

## 2020-11-11 DIAGNOSIS — J309 Allergic rhinitis, unspecified: Secondary | ICD-10-CM

## 2020-11-13 ENCOUNTER — Other Ambulatory Visit: Payer: Self-pay | Admitting: Adult Health

## 2020-11-22 ENCOUNTER — Ambulatory Visit
Admission: RE | Admit: 2020-11-22 | Discharge: 2020-11-22 | Disposition: A | Discharge status: Home / Self Care | Payer: Medicare HMO | Source: Ambulatory Visit | Attending: Adult Health | Admitting: Adult Health

## 2020-11-22 DIAGNOSIS — Z1231 Encounter for screening mammogram for malignant neoplasm of breast: Secondary | ICD-10-CM | POA: Diagnosis not present

## 2020-12-17 DIAGNOSIS — H524 Presbyopia: Secondary | ICD-10-CM | POA: Diagnosis not present

## 2020-12-17 DIAGNOSIS — H2513 Age-related nuclear cataract, bilateral: Secondary | ICD-10-CM | POA: Diagnosis not present

## 2021-02-08 ENCOUNTER — Other Ambulatory Visit: Payer: Self-pay | Admitting: Adult Health

## 2021-03-21 ENCOUNTER — Encounter: Payer: Self-pay | Admitting: Adult Health

## 2021-03-21 ENCOUNTER — Ambulatory Visit (INDEPENDENT_AMBULATORY_CARE_PROVIDER_SITE_OTHER): Payer: Medicare HMO | Admitting: Adult Health

## 2021-03-21 VITALS — BP 132/80 | HR 78 | Temp 98.6°F | Ht 59.0 in | Wt 103.0 lb

## 2021-03-21 DIAGNOSIS — Z0000 Encounter for general adult medical examination without abnormal findings: Secondary | ICD-10-CM | POA: Diagnosis not present

## 2021-03-21 DIAGNOSIS — E782 Mixed hyperlipidemia: Secondary | ICD-10-CM | POA: Diagnosis not present

## 2021-03-21 DIAGNOSIS — R69 Illness, unspecified: Secondary | ICD-10-CM | POA: Diagnosis not present

## 2021-03-21 DIAGNOSIS — F32A Depression, unspecified: Secondary | ICD-10-CM

## 2021-03-21 LAB — CBC WITH DIFFERENTIAL/PLATELET
Basophils Absolute: 0 10*3/uL (ref 0.0–0.1)
Basophils Relative: 0.4 % (ref 0.0–3.0)
Eosinophils Absolute: 0.1 10*3/uL (ref 0.0–0.7)
Eosinophils Relative: 2.2 % (ref 0.0–5.0)
HCT: 41.5 % (ref 36.0–46.0)
Hemoglobin: 13.5 g/dL (ref 12.0–15.0)
Lymphocytes Relative: 42.3 % (ref 12.0–46.0)
Lymphs Abs: 2.3 10*3/uL (ref 0.7–4.0)
MCHC: 32.7 g/dL (ref 30.0–36.0)
MCV: 89.9 fl (ref 78.0–100.0)
Monocytes Absolute: 0.5 10*3/uL (ref 0.1–1.0)
Monocytes Relative: 9.7 % (ref 3.0–12.0)
Neutro Abs: 2.5 10*3/uL (ref 1.4–7.7)
Neutrophils Relative %: 45.4 % (ref 43.0–77.0)
Platelets: 197 10*3/uL (ref 150.0–400.0)
RBC: 4.61 Mil/uL (ref 3.87–5.11)
RDW: 13.9 % (ref 11.5–15.5)
WBC: 5.4 10*3/uL (ref 4.0–10.5)

## 2021-03-21 LAB — COMPREHENSIVE METABOLIC PANEL
ALT: 17 U/L (ref 0–35)
AST: 20 U/L (ref 0–37)
Albumin: 4.4 g/dL (ref 3.5–5.2)
Alkaline Phosphatase: 60 U/L (ref 39–117)
BUN: 14 mg/dL (ref 6–23)
CO2: 36 mEq/L — ABNORMAL HIGH (ref 19–32)
Calcium: 10.1 mg/dL (ref 8.4–10.5)
Chloride: 101 mEq/L (ref 96–112)
Creatinine, Ser: 0.98 mg/dL (ref 0.40–1.20)
GFR: 59.06 mL/min — ABNORMAL LOW (ref >60.00)
Glucose, Bld: 81 mg/dL (ref 70–99)
Potassium: 4.6 mEq/L (ref 3.5–5.1)
Sodium: 139 mEq/L (ref 135–145)
Total Bilirubin: 0.5 mg/dL (ref 0.2–1.2)
Total Protein: 7 g/dL (ref 6.0–8.3)

## 2021-03-21 LAB — LIPID PANEL
Cholesterol: 224 mg/dL — ABNORMAL HIGH (ref 0–200)
HDL: 87.3 mg/dL (ref >39.00)
LDL Cholesterol: 121 mg/dL — ABNORMAL HIGH (ref 0–99)
NonHDL: 136.51
Total CHOL/HDL Ratio: 3
Triglycerides: 79 mg/dL (ref 0.0–149.0)
VLDL: 15.8 mg/dL (ref 0.0–40.0)

## 2021-03-21 LAB — TSH: TSH: 2.36 u[IU]/mL (ref 0.35–5.50)

## 2021-03-21 MED ORDER — BUPROPION HCL ER (SR) 150 MG PO TB12: 150.0000 mg | ORAL_TABLET | Freq: Two times a day (BID) | ORAL | 0 refills | Status: DC

## 2021-03-21 NOTE — Patient Instructions (Addendum)
It was great seeing you today   We will follow up with you regarding your lab work   Please let me know if you need anything   To wean off Wellbutrin I recommend a slow taper   150 mg twice a day for week 150 mg daily for week  150 mg every other day for a week  150 mg every two days for a week  150 mg every three days for a week  150 mg every 4 days for a week  Then stop

## 2021-03-21 NOTE — Progress Notes (Signed)
Subjective:    Patient ID: Denise Bradley, female    DOB: [birth year], 71 y.o.   MRN: [id number]  HPI Patient presents for yearly preventative medicine examination. She is a pleasant 71 year old female who  has a past medical history of Atypical mole (03/14/2004), Atypical mole (03/01/2013), Atypical mole (03/01/2013), Depression, and Hyperlipidemia.  Anxiety/Depression -is well controlled with Wellbutrin 300 mg extended release.  She denies depressive symptoms.  Takes Xanax as needed for sleep. She would like to try and wean off her Wellbutrin.   Hyperlipidemia- not currently on medication, has been recommended that she go on a low-dose statin but has refused.  In the past Lab Results  Component Value Date   CHOL 219 (H) 06/18/2020   HDL 84.10 06/18/2020   LDLCALC 110 (H) 06/18/2020   LDLDIRECT 126.6 09/24/2011   TRIG 126.0 06/18/2020   CHOLHDL 3 06/18/2020   All immunizations and health maintenance protocols were reviewed with the patient and needed orders were placed. UTD   Appropriate screening laboratory values were ordered for the patient including screening of hyperlipidemia, renal function and hepatic function.  Medication reconciliation,  past medical history, social history, problem list and allergies were reviewed in detail with the patient  Goals were established with regard to weight loss, exercise, and  diet in compliance with medications Wt Readings from Last 3 Encounters:  03/21/21 103 lb (46.7 kg)  05/07/20 102 lb 12.8 oz (46.6 kg)  03/19/20 104 lb (47.2 kg)   She is up-to-date on routine colon cancer screening as well as mammograms.  Has no acute complaints   Review of Systems  Constitutional: Negative.   HENT: Negative.    Eyes: Negative.   Respiratory: Negative.    Cardiovascular: Negative.   Gastrointestinal: Negative.   Endocrine: Negative.   Genitourinary: Negative.   Musculoskeletal: Negative.   Skin: Negative.   Allergic/Immunologic:  Negative.   Neurological: Negative.   Hematological: Negative.   Psychiatric/Behavioral: Negative.    Past Medical History:  Diagnosis Date   Atypical mole 03/14/2004   Mid Upper Buttocks (slight)   Atypical mole 03/01/2013   Right Shoulder (mild)   Atypical mole 03/01/2013   Left Back (mild)   Depression    Hyperlipidemia     Social History   Socioeconomic History   Marital status: Widowed    Spouse name: Not on file   Number of children: Not on file   Years of education: Not on file   Highest education level: Not on file  Occupational History   Not on file  Tobacco Use   Smoking status: Never   Smokeless tobacco: Never  Vaping Use   Vaping Use: Never used  Substance and Sexual Activity   Alcohol use: Yes    Alcohol/week: 7.0 standard drinks    Types: 7 Glasses of wine per week   Drug use: No   Sexual activity: Not Currently    Birth control/protection: Post-menopausal  Other Topics Concern   Not on file  Social History Narrative   Retired - Worked with [place] and last May she was laid off. She worked there for 22 years   Going back to school for [employer].    No pets   Three children ( [place], [place]. [place] ( Son and two daughters- all married. & grandchildren)   States " Does not do anything fun".          Diet: Eat healthy, does not eat fast food.    Exercise:  Does not exercise, does not feel like she has time.       Social Determinants of Health   Financial Resource Strain: Not on file  Food Insecurity: Not on file  Transportation Needs: Not on file  Physical Activity: Not on file  Stress: Not on file  Social Connections: Not on file  Intimate Partner Violence: Not on file    Past Surgical History:  Procedure Laterality Date   [place] SURGERY  01/15/2012   right   FOOT SURGERY  July 2014   right   KNEE ARTHROSCOPY Right    age 69   LIPOSUCTION  5/05    Family History  Problem Relation  Age of Onset   Diabetes Father    Hypertension Father    Heart disease Father        CABG   Cancer Mother 65       cancer unknown primary   COPD Mother    Breast cancer Paternal Aunt    Colon cancer Neg Hx     Allergies  Allergen Reactions   Acetaminophen Anaphylaxis    REACTION: swells throat shut/can't breathe   Clindamycin/Lincomycin Other (See Comments)    Cause Loose Stools// Diarrhea    Current Outpatient Medications on File Prior to Visit  Medication Sig Dispense Refill   ALPRAZolam (XANAX) 0.5 MG tablet Take 1 tablet (0.5 mg total) by mouth at bedtime as needed for anxiety. 15 tablet 0   buPROPion (WELLBUTRIN XL) 300 MG 24 hr tablet TAKE 1 TABLET BY MOUTH EVERY DAY 90 tablet 0   Cyanocobalamin (B-12 PO) Take 1,000 mcg by mouth.     fluticasone (FLONASE) 50 MCG/ACT nasal spray SPRAY 2 SPRAYS INTO EACH NOSTRIL EVERY DAY 48 mL 1   FOLIC ACID PO Take 937 mg by mouth.      LORazepam (ATIVAN) 1 MG tablet lorazepam 1 mg tablet     NON FORMULARY Protandim Synergizer - take one caplet by mouth daily     OVER THE COUNTER MEDICATION Natural D-Hist     Probiotic Product (PROBIOTIC PO) Take by mouth. Women's Probiotic - [place] of [place]     MAGNESIUM PO Take 200 mg by mouth daily. Glycinate complex (Patient not taking: Reported on 03/21/2021)     No current facility-administered medications on file prior to visit.    BP 140/80    Pulse 78    Temp 98.6 F (37 C) (Oral)    Ht 4\' 11"  (1.499 m)    Wt 103 lb (46.7 kg)    LMP 01/26/2002 (Approximate)    SpO2 99%    BMI 20.80 kg/m        Objective:   Physical Exam Vitals and nursing note reviewed.  Constitutional:      General: She is not in acute distress.    Appearance: Normal appearance. She is well-developed. She is not ill-appearing.  HENT:     Head: Normocephalic and atraumatic.     Right Ear: Tympanic membrane, ear canal and external ear normal. There is no impacted cerumen.     Left Ear: Tympanic membrane, ear canal and  external ear normal. There is no impacted cerumen.     Nose: Nose normal. No congestion or rhinorrhea.     Mouth/Throat:     Mouth: Mucous membranes are moist.     Pharynx: Oropharynx is clear. No oropharyngeal exudate or posterior oropharyngeal erythema.  Eyes:  General:        Right eye: No discharge.        Left eye: No discharge.     Extraocular Movements: Extraocular movements intact.     Conjunctiva/sclera: Conjunctivae normal.     Pupils: Pupils are equal, round, and reactive to light.  Neck:     Thyroid: No thyromegaly.     Vascular: No carotid bruit.     Trachea: No tracheal deviation.  Cardiovascular:     Rate and Rhythm: Normal rate and regular rhythm.     Pulses: Normal pulses.     Heart sounds: Normal heart sounds. No murmur heard.   No friction rub. No gallop.  Pulmonary:     Effort: Pulmonary effort is normal. No respiratory distress.     Breath sounds: Normal breath sounds. No stridor. No wheezing, rhonchi or rales.  Chest:     Chest wall: No tenderness.  Abdominal:     General: Abdomen is flat. Bowel sounds are normal. There is no distension.     Palpations: Abdomen is soft. There is no mass.     Tenderness: There is no abdominal tenderness. There is no right CVA tenderness, left CVA tenderness, guarding or rebound.     Hernia: No hernia is present.  Musculoskeletal:        General: No swelling, tenderness, deformity or signs of injury. Normal range of motion.     Cervical back: Normal range of motion and neck supple.     Right lower leg: No edema.     Left lower leg: No edema.  Lymphadenopathy:     Cervical: No cervical adenopathy.  Skin:    General: Skin is warm and dry.     Coloration: Skin is not jaundiced or pale.     Findings: No bruising, erythema, lesion or rash.  Neurological:     General: No focal deficit present.     Mental Status: She is alert and oriented to person, place, and time.     Cranial Nerves: No cranial nerve deficit.      Sensory: No sensory deficit.     Motor: No weakness.     Coordination: Coordination normal.     Gait: Gait normal.     Deep Tendon Reflexes: Reflexes normal.  Psychiatric:        Mood and Affect: Mood normal.        Behavior: Behavior normal.        Thought Content: Thought content normal.        Judgment: Judgment normal.      Assessment & Plan:  1. Routine general medical examination at a health care facility - Continue to stay active and exercise - Follow up in one year or sooner if needed - CBC with Differential/Platelet; Future - Comprehensive metabolic panel; Future - Lipid panel; Future - TSH; Future - TSH - Lipid panel - Comprehensive metabolic panel - CBC with Differential/Platelet  2. Mixed hyperlipidemia - Continues to refuse statin therapy  - CBC with Differential/Platelet; Future - Comprehensive metabolic panel; Future - Lipid panel; Future - TSH; Future - TSH - Lipid panel - Comprehensive metabolic panel - CBC with Differential/Platelet  3. Depression, unspecified depression type Will have her taper her wellbutrin  150 mg twice a day for week 150 mg daily for week  150 mg every other day for a week  150 mg every two days for a week  150 mg every three days for a week  150 mg every 4  days for a week  Then stop  - buPROPion (WELLBUTRIN SR) 150 MG 12 hr tablet; Take 1 tablet (150 mg total) by mouth 2 (two) times daily.  Dispense: 60 tablet; Refill: 0  Dorothyann Peng, NP

## 2021-04-18 ENCOUNTER — Other Ambulatory Visit: Payer: Self-pay | Admitting: Adult Health

## 2021-04-18 DIAGNOSIS — F32A Depression, unspecified: Secondary | ICD-10-CM

## 2021-05-16 ENCOUNTER — Other Ambulatory Visit: Payer: Self-pay | Admitting: Adult Health

## 2021-05-28 DIAGNOSIS — Z0100 Encounter for examination of eyes and vision without abnormal findings: Secondary | ICD-10-CM | POA: Diagnosis not present

## 2021-08-06 ENCOUNTER — Ambulatory Visit (INDEPENDENT_AMBULATORY_CARE_PROVIDER_SITE_OTHER): Payer: Medicare HMO | Admitting: Adult Health

## 2021-08-06 ENCOUNTER — Encounter: Payer: Self-pay | Admitting: Adult Health

## 2021-08-06 VITALS — BP 108/80 | HR 72 | Temp 97.9°F | Ht 59.0 in | Wt 105.0 lb

## 2021-08-06 DIAGNOSIS — L578 Other skin changes due to chronic exposure to nonionizing radiation: Secondary | ICD-10-CM | POA: Diagnosis not present

## 2021-08-06 NOTE — Progress Notes (Signed)
Subjective:    Patient ID: Denise Bradley, female    DOB: 04-Oct-1950, 71 y.o.   MRN: [id number]  HPI 71 year old female who  has a past medical history of Atypical mole (03/14/2004), Atypical mole (03/01/2013), Atypical mole (03/01/2013), Depression, and Hyperlipidemia.  she is being evaluated today for an acute issue.  Reports that a month ago she was down in [place] when out on a boat and became sunburned.  Shortly after she developed some small red itchy bumps, some of these bumps have healed some have not.  The nonhealing bumps continue to be itchy.  She has applied Neosporin and hydrocortisone cream.  She does have a dermatologist but reports never having a full skin exam.  Review of Systems See HPI   Past Medical History:  Diagnosis Date   Atypical mole 03/14/2004   Mid Upper Buttocks (slight)   Atypical mole 03/01/2013   Right Shoulder (mild)   Atypical mole 03/01/2013   Left Back (mild)   Depression    Hyperlipidemia     Social History   Socioeconomic History   Marital status: Widowed    Spouse name: Not on file   Number of children: Not on file   Years of education: Not on file   Highest education level: Not on file  Occupational History   Not on file  Tobacco Use   Smoking status: Never   Smokeless tobacco: Never  Vaping Use   Vaping Use: Never used  Substance and Sexual Activity   Alcohol use: Yes    Alcohol/week: 7.0 standard drinks of alcohol    Types: 7 Glasses of wine per week   Drug use: No   Sexual activity: Not Currently    Birth control/protection: Post-menopausal  Other Topics Concern   Not on file  Social History Narrative   Retired - Worked with [place] and last May she was laid off. She worked there for 22 years   Going back to school for [employer].    No pets   Three children ( [place], [place]. [place] ( Son and two daughters- all married. & grandchildren)   States " Does not do anything fun".          Diet: Eat healthy,  does not eat fast food.    Exercise: Does not exercise, does not feel like she has time.       Social Determinants of Health   Financial Resource Strain: Not on file  Food Insecurity: Not on file  Transportation Needs: Not on file  Physical Activity: Not on file  Stress: Not on file  Social Connections: Not on file  Intimate Partner Violence: Not on file    Past Surgical History:  Procedure Laterality Date   [place] SURGERY  01/15/2012   right   FOOT SURGERY  July 2014   right   KNEE ARTHROSCOPY Right    age 27   LIPOSUCTION  5/05    Family History  Problem Relation Age of Onset   Diabetes Father    Hypertension Father    Heart disease Father        CABG   Cancer Mother 102       cancer unknown primary   COPD Mother    Breast cancer Paternal Aunt    Colon cancer Neg Hx     Allergies  Allergen Reactions   Acetaminophen Anaphylaxis    REACTION: swells throat shut/can't  breathe   Clindamycin/Lincomycin Other (See Comments)    Cause Loose Stools// Diarrhea    Current Outpatient Medications on File Prior to Visit  Medication Sig Dispense Refill   ALPRAZolam (XANAX) 0.5 MG tablet Take 1 tablet (0.5 mg total) by mouth at bedtime as needed for anxiety. 15 tablet 0   Cyanocobalamin (B-12 PO) Take 1,000 mcg by mouth.     fluticasone (FLONASE) 50 MCG/ACT nasal spray SPRAY 2 SPRAYS INTO EACH NOSTRIL EVERY DAY 48 mL 1   FOLIC ACID PO Take 889 mg by mouth.      MAGNESIUM PO Take 200 mg by mouth daily. Glycinate complex     NON FORMULARY Protandim Synergizer - take one caplet by mouth daily     OVER THE COUNTER MEDICATION Natural D-Hist     Probiotic Product (PROBIOTIC PO) Take by mouth. Women's Probiotic - Garden of Eden     buPROPion (WELLBUTRIN SR) 150 MG 12 hr tablet TAKE 1 TABLET BY MOUTH TWICE A DAY (Patient not taking: Reported on 08/06/2021) 180 tablet 1   LORazepam (ATIVAN) 1 MG tablet lorazepam 1 mg tablet (Patient not  taking: Reported on 08/06/2021)     No current facility-administered medications on file prior to visit.    BP 108/80   Pulse 72   Temp 97.9 F (36.6 C) (Oral)   Ht '4\' 11"'$  (1.499 m)   Wt 105 lb (47.6 kg)   LMP 01/26/2002 (Approximate)   SpO2 100%   BMI 21.21 kg/m       Objective:   Physical Exam Vitals and nursing note reviewed.  Constitutional:      Appearance: Normal appearance.  Skin:    General: Skin is warm and dry.     Capillary Refill: Capillary refill takes less than 2 seconds.     Findings: Lesion present.       Neurological:     General: No focal deficit present.     Mental Status: She is oriented to person, place, and time.  Psychiatric:        Mood and Affect: Mood normal.        Behavior: Behavior normal.        Thought Content: Thought content normal.        Judgment: Judgment normal.       Assessment & Plan:  1. Sun-damaged skin -Possible keratoses.  I doubt basal cell or squamous cell carcinoma that developed in less than a month.  Advised to refrain from itching so the wound could heal properly.  She will follow-up with her dermatologist for full skin exam.  Dorothyann Peng, NP

## 2021-08-19 ENCOUNTER — Ambulatory Visit: Payer: Medicare HMO | Admitting: Dermatology

## 2021-08-19 ENCOUNTER — Encounter: Payer: Self-pay | Admitting: Dermatology

## 2021-08-19 DIAGNOSIS — D485 Neoplasm of uncertain behavior of skin: Secondary | ICD-10-CM

## 2021-08-19 DIAGNOSIS — Z1283 Encounter for screening for malignant neoplasm of skin: Secondary | ICD-10-CM | POA: Diagnosis not present

## 2021-08-19 DIAGNOSIS — C44529 Squamous cell carcinoma of skin of other part of trunk: Secondary | ICD-10-CM | POA: Diagnosis not present

## 2021-08-19 NOTE — Patient Instructions (Signed)

## 2021-09-08 ENCOUNTER — Other Ambulatory Visit: Payer: Self-pay | Admitting: Adult Health

## 2021-09-08 DIAGNOSIS — F32A Depression, unspecified: Secondary | ICD-10-CM

## 2021-09-08 NOTE — Telephone Encounter (Signed)
Pt requesting refill of  ALPRAZolam (XANAX) 0.5 MG tablet   [id number]- [place], [place] - [address]  [phone] Fax:  [phone]

## 2021-09-08 NOTE — Telephone Encounter (Signed)
Last OV 08/06/21 for acute; 03/21/21 CPE Next OV: 03/24/22

## 2021-09-09 MED ORDER — ALPRAZOLAM 0.5 MG PO TABS
0.5000 mg | ORAL_TABLET | Freq: Every evening | ORAL | 0 refills | Status: DC | PRN
Start: 2021-09-09 — End: 2022-06-09

## 2021-09-10 ENCOUNTER — Encounter: Payer: Self-pay | Admitting: Dermatology

## 2021-09-10 NOTE — Progress Notes (Signed)
   Follow-Up Visit   Subjective  Denise Bradley is a 71 y.o. female who presents for the following: Annual Exam (Left chest x 1 month raised lesion no bleeding ).  General skin check, new growth on left chest Location:  Duration:  Quality:  Associated Signs/Symptoms: Modifying Factors:  Severity:  Timing: Context:   Objective  Well appearing patient in no apparent distress; mood and affect are within normal limits. left chest 7 mm slightly tender volcano like crusted nodule, SCCA/KA       A full examination was performed including scalp, head, eyes, ears, nose, lips, neck, chest, axillae, abdomen, back, buttocks, bilateral upper extremities, bilateral lower extremities, hands, feet, fingers, toes, fingernails, and toenails. All findings within normal limits unless otherwise noted below.  Is beneath undergarments not fully examined.   Assessment & Plan    Squamous cell carcinoma of skin of chest left chest  Skin / nail biopsy Type of biopsy: tangential   Informed consent: discussed and consent obtained   Timeout: patient name, date of birth, surgical site, and procedure verified   Anesthesia: the lesion was anesthetized in a standard fashion   Anesthetic:  1% lidocaine w/ epinephrine 1-100,000 local infiltration Instrument used: flexible razor blade   Hemostasis achieved with: aluminum chloride and electrodesiccation   Outcome: patient tolerated procedure well   Post-procedure details: wound care instructions given    Destruction of lesion Complexity: simple   Destruction method: electrodesiccation and curettage   Informed consent: discussed and consent obtained   Timeout:  patient name, date of birth, surgical site, and procedure verified Anesthesia: the lesion was anesthetized in a standard fashion   Anesthetic:  1% lidocaine w/ epinephrine 1-100,000 local infiltration Curettage performed in three different directions: Yes   Curettage cycles:  3 Lesion  length (cm):  0.7 Lesion width (cm):  0.7 Margin per side (cm):  0 Final wound size (cm):  0.7 Hemostasis achieved with:  aluminum chloride Outcome: patient tolerated procedure well with no complications   Post-procedure details: wound care instructions given    Specimen 1 - Surgical pathology Differential Diagnosis: KA tx with bx  Check Margins: No  After shave biopsy the base of the lesion was treated with curettage plus cautery.      I, Lavonna Monarch, MD, have reviewed all documentation for this visit.  The documentation on 09/10/21 for the exam, diagnosis, procedures, and orders are all accurate and complete.

## 2021-09-23 DIAGNOSIS — Z85828 Personal history of other malignant neoplasm of skin: Secondary | ICD-10-CM | POA: Diagnosis not present

## 2021-09-23 DIAGNOSIS — L570 Actinic keratosis: Secondary | ICD-10-CM | POA: Diagnosis not present

## 2021-09-23 DIAGNOSIS — L814 Other melanin hyperpigmentation: Secondary | ICD-10-CM | POA: Diagnosis not present

## 2021-09-23 DIAGNOSIS — L821 Other seborrheic keratosis: Secondary | ICD-10-CM | POA: Diagnosis not present

## 2021-10-10 ENCOUNTER — Other Ambulatory Visit: Payer: Self-pay | Admitting: Adult Health

## 2021-10-10 DIAGNOSIS — Z1231 Encounter for screening mammogram for malignant neoplasm of breast: Secondary | ICD-10-CM

## 2021-11-24 ENCOUNTER — Ambulatory Visit
Admission: RE | Admit: 2021-11-24 | Discharge: 2021-11-24 | Disposition: A | Discharge status: Home / Self Care | Payer: Medicare HMO | Source: Ambulatory Visit | Attending: Adult Health | Admitting: Adult Health

## 2021-11-24 DIAGNOSIS — Z1231 Encounter for screening mammogram for malignant neoplasm of breast: Secondary | ICD-10-CM | POA: Diagnosis not present

## 2021-12-26 DIAGNOSIS — H40023 Open angle with borderline findings, high risk, bilateral: Secondary | ICD-10-CM | POA: Diagnosis not present

## 2021-12-26 DIAGNOSIS — H2513 Age-related nuclear cataract, bilateral: Secondary | ICD-10-CM | POA: Diagnosis not present

## 2021-12-26 DIAGNOSIS — H47021 Hemorrhage in optic nerve sheath, right eye: Secondary | ICD-10-CM | POA: Diagnosis not present

## 2022-02-17 DIAGNOSIS — H2513 Age-related nuclear cataract, bilateral: Secondary | ICD-10-CM | POA: Diagnosis not present

## 2022-02-17 DIAGNOSIS — H47021 Hemorrhage in optic nerve sheath, right eye: Secondary | ICD-10-CM | POA: Diagnosis not present

## 2022-02-24 DIAGNOSIS — Z0100 Encounter for examination of eyes and vision without abnormal findings: Secondary | ICD-10-CM | POA: Diagnosis not present

## 2022-03-24 ENCOUNTER — Encounter: Payer: Medicare HMO | Admitting: Adult Health

## 2022-03-24 DIAGNOSIS — R09A2 Foreign body sensation, throat: Secondary | ICD-10-CM | POA: Diagnosis not present

## 2022-04-01 ENCOUNTER — Encounter: Payer: Self-pay | Admitting: Adult Health

## 2022-04-01 ENCOUNTER — Ambulatory Visit (INDEPENDENT_AMBULATORY_CARE_PROVIDER_SITE_OTHER): Payer: Medicare HMO | Admitting: Adult Health

## 2022-04-01 VITALS — BP 138/80 | HR 64 | Temp 97.8°F | Ht 59.0 in | Wt 106.0 lb

## 2022-04-01 DIAGNOSIS — R69 Illness, unspecified: Secondary | ICD-10-CM | POA: Diagnosis not present

## 2022-04-01 DIAGNOSIS — E782 Mixed hyperlipidemia: Secondary | ICD-10-CM

## 2022-04-01 DIAGNOSIS — F32A Depression, unspecified: Secondary | ICD-10-CM

## 2022-04-01 DIAGNOSIS — Z0000 Encounter for general adult medical examination without abnormal findings: Secondary | ICD-10-CM

## 2022-04-01 DIAGNOSIS — F419 Anxiety disorder, unspecified: Secondary | ICD-10-CM

## 2022-04-01 DIAGNOSIS — F5101 Primary insomnia: Secondary | ICD-10-CM | POA: Diagnosis not present

## 2022-04-01 DIAGNOSIS — M810 Age-related osteoporosis without current pathological fracture: Secondary | ICD-10-CM

## 2022-04-01 LAB — LIPID PANEL
Cholesterol: 222 mg/dL — ABNORMAL HIGH (ref 0–200)
HDL: 85.3 mg/dL (ref >39.00)
LDL Cholesterol: 124 mg/dL — ABNORMAL HIGH (ref 0–99)
NonHDL: 136.28
Total CHOL/HDL Ratio: 3
Triglycerides: 60 mg/dL (ref 0.0–149.0)
VLDL: 12 mg/dL (ref 0.0–40.0)

## 2022-04-01 LAB — CBC WITH DIFFERENTIAL/PLATELET
Basophils Absolute: 0 10*3/uL (ref 0.0–0.1)
Basophils Relative: 0.5 % (ref 0.0–3.0)
Eosinophils Absolute: 0.1 10*3/uL (ref 0.0–0.7)
Eosinophils Relative: 1.5 % (ref 0.0–5.0)
HCT: 42.6 % (ref 36.0–46.0)
Hemoglobin: 14.2 g/dL (ref 12.0–15.0)
Lymphocytes Relative: 38.6 % (ref 12.0–46.0)
Lymphs Abs: 2.3 10*3/uL (ref 0.7–4.0)
MCHC: 33.3 g/dL (ref 30.0–36.0)
MCV: 88.2 fl (ref 78.0–100.0)
Monocytes Absolute: 0.6 10*3/uL (ref 0.1–1.0)
Monocytes Relative: 9.8 % (ref 3.0–12.0)
Neutro Abs: 3 10*3/uL (ref 1.4–7.7)
Neutrophils Relative %: 49.6 % (ref 43.0–77.0)
Platelets: 211 10*3/uL (ref 150.0–400.0)
RBC: 4.83 Mil/uL (ref 3.87–5.11)
RDW: 13.6 % (ref 11.5–15.5)
WBC: 6.1 10*3/uL (ref 4.0–10.5)

## 2022-04-01 LAB — COMPREHENSIVE METABOLIC PANEL
ALT: 14 U/L (ref 0–35)
AST: 22 U/L (ref 0–37)
Albumin: 4.2 g/dL (ref 3.5–5.2)
Alkaline Phosphatase: 66 U/L (ref 39–117)
BUN: 15 mg/dL (ref 6–23)
CO2: 29 mEq/L (ref 19–32)
Calcium: 9.9 mg/dL (ref 8.4–10.5)
Chloride: 102 mEq/L (ref 96–112)
Creatinine, Ser: 0.81 mg/dL (ref 0.40–1.20)
GFR: 73.69 mL/min (ref >60.00)
Glucose, Bld: 81 mg/dL (ref 70–99)
Potassium: 4.8 mEq/L (ref 3.5–5.1)
Sodium: 140 mEq/L (ref 135–145)
Total Bilirubin: 0.6 mg/dL (ref 0.2–1.2)
Total Protein: 7 g/dL (ref 6.0–8.3)

## 2022-04-01 LAB — TSH: TSH: 1.92 u[IU]/mL (ref 0.35–5.50)

## 2022-04-01 LAB — VITAMIN D 25 HYDROXY (VIT D DEFICIENCY, FRACTURES): VITD: 88.72 ng/mL (ref 30.00–100.00)

## 2022-04-01 NOTE — Progress Notes (Signed)
Subjective:    Patient ID: Denise Bradley, female    DOB: [birth year], 72 y.o.   MRN: [id number]  HPI Patient presents for yearly preventative medicine examination She is a pleasant 72 year old female who  has a past medical history of Atypical mole (03/14/2004), Atypical mole (03/01/2013), Atypical mole (03/01/2013), Depression, and Hyperlipidemia.  Insomnia - takes Xanax PRN   Anxiety and Depression - weaned herself off Wellbutrin. No longer feels like she needs it  Hyperlipidemia- not currently on medication, has been recommended that she go on a low-dose statin but has refused in the past  Lab Results  Component Value Date   CHOL 224 (H) 03/21/2021   HDL 87.30 03/21/2021   LDLCALC 121 (H) 03/21/2021   LDLDIRECT 126.6 09/24/2011   TRIG 79.0 03/21/2021   CHOLHDL 3 03/21/2021   Osteoporosis - takes calcium and vitamin D - last bone density screen in 2021. She will see her GYN next month. She declined any additional medication back in 2021.    All immunizations and health maintenance protocols were reviewed with the patient and needed orders were placed.  Appropriate screening laboratory values were ordered for the patient including screening of hyperlipidemia, renal function and hepatic function.  Medication reconciliation,  past medical history, social history, problem list and allergies were reviewed in detail with the patient  Goals were established with regard to weight loss, exercise, and  diet in compliance with medications. She does stay active and eat healthy.  Wt Readings from Last 3 Encounters:  04/01/22 106 lb (48.1 kg)  08/06/21 105 lb (47.6 kg)  03/21/21 103 lb (46.7 kg)   Review of Systems  Constitutional: Negative.   HENT: Negative.    Eyes: Negative.   Respiratory: Negative.    Cardiovascular: Negative.   Gastrointestinal: Negative.   Endocrine: Negative.   Genitourinary: Negative.   Musculoskeletal: Negative.   Skin: Negative.    Allergic/Immunologic: Negative.   Neurological: Negative.   Hematological: Negative.   Psychiatric/Behavioral: Negative.     Past Medical History:  Diagnosis Date   Atypical mole 03/14/2004   Mid Upper Buttocks (slight)   Atypical mole 03/01/2013   Right Shoulder (mild)   Atypical mole 03/01/2013   Left Back (mild)   Depression    Hyperlipidemia     Social History   Socioeconomic History   Marital status: Widowed    Spouse name: Not on file   Number of children: Not on file   Years of education: Not on file   Highest education level: Not on file  Occupational History   Not on file  Tobacco Use   Smoking status: Never   Smokeless tobacco: Never  Vaping Use   Vaping Use: Never used  Substance and Sexual Activity   Alcohol use: Yes    Alcohol/week: 7.0 standard drinks of alcohol    Types: 7 Glasses of wine per week   Drug use: No   Sexual activity: Not Currently    Birth control/protection: Post-menopausal  Other Topics Concern   Not on file  Social History Narrative   Retired - Worked with [employer] and last May she was laid off. She worked there for 22 years   Going back to school for [employer].    No pets   Three children ( [place], [place]. [place] ( Son and two daughters- all married. & grandchildren)   States " Does not do anything fun".          Diet: Eat healthy,  does not eat fast food.    Exercise: Does not exercise, does not feel like she has time.       Social Determinants of Health   Financial Resource Strain: Not on file  Food Insecurity: Not on file  Transportation Needs: Not on file  Physical Activity: Not on file  Stress: Not on file  Social Connections: Not on file  Intimate Partner Violence: Not on file    Past Surgical History:  Procedure Laterality Date   [place] SURGERY  01/15/2012   right   FOOT SURGERY  July 2014   right   KNEE ARTHROSCOPY Right    age 70   LIPOSUCTION  5/05     Family History  Problem Relation Age of Onset   Diabetes Father    Hypertension Father    Heart disease Father        CABG   Cancer Mother 61       cancer unknown primary   COPD Mother    Breast cancer Paternal Aunt    Colon cancer Neg Hx     Allergies  Allergen Reactions   Acetaminophen Anaphylaxis    REACTION: swells throat shut/can't breathe   Clindamycin/Lincomycin Other (See Comments)    Cause Loose Stools// Diarrhea    Current Outpatient Medications on File Prior to Visit  Medication Sig Dispense Refill   ALPRAZolam (XANAX) 0.5 MG tablet Take 1 tablet (0.5 mg total) by mouth at bedtime as needed for anxiety. 15 tablet 0   Cyanocobalamin (B-12 PO) Take 1,000 mcg by mouth.     fluticasone (FLONASE) 50 MCG/ACT nasal spray SPRAY 2 SPRAYS INTO EACH NOSTRIL EVERY DAY 48 mL 1   FOLIC ACID PO Take [id number] mg by mouth.      NON FORMULARY Protandim Synergizer - take one caplet by mouth daily     OVER THE COUNTER MEDICATION Natural D-Hist     Probiotic Product (PROBIOTIC PO) Take by mouth. Women's Probiotic - Garden of Eden     Calcium 200 MG TABS Calcium     No current facility-administered medications on file prior to visit.    BP 138/80   Pulse 64   Temp 97.8 F (36.6 C) (Oral)   Ht '4\' 11"'$  (1.499 m)   Wt 106 lb (48.1 kg)   LMP 01/26/2002 (Approximate)   SpO2 99%   BMI 21.41 kg/m       Objective:   Physical Exam Vitals and nursing note reviewed.  Constitutional:      General: She is not in acute distress.    Appearance: Normal appearance. She is not ill-appearing.  HENT:     Head: Normocephalic and atraumatic.     Right Ear: Tympanic membrane, ear canal and external ear normal. There is no impacted cerumen.     Left Ear: Tympanic membrane, ear canal and external ear normal. There is no impacted cerumen.     Nose: Nose normal. No congestion or rhinorrhea.     Mouth/Throat:     Mouth: Mucous membranes are moist.     Pharynx: Oropharynx is clear.  Eyes:      Extraocular Movements: Extraocular movements intact.     Conjunctiva/sclera: Conjunctivae normal.     Pupils: Pupils are equal, round, and reactive to light.  Neck:     Vascular: No carotid bruit.  Cardiovascular:     Rate and Rhythm: Normal rate and regular rhythm.  Pulses: Normal pulses.     Heart sounds: No murmur heard.    No friction rub. No gallop.  Pulmonary:     Effort: Pulmonary effort is normal.     Breath sounds: Normal breath sounds.  Abdominal:     General: Abdomen is flat. Bowel sounds are normal. There is no distension.     Palpations: Abdomen is soft. There is no mass.     Tenderness: There is no abdominal tenderness. There is no guarding or rebound.     Hernia: No hernia is present.  Musculoskeletal:        General: Normal range of motion.     Cervical back: Normal range of motion and neck supple.  Lymphadenopathy:     Cervical: No cervical adenopathy.  Skin:    General: Skin is warm and dry.     Capillary Refill: Capillary refill takes less than 2 seconds.  Neurological:     General: No focal deficit present.     Mental Status: She is alert and oriented to person, place, and time.  Psychiatric:        Mood and Affect: Mood normal.        Behavior: Behavior normal.        Thought Content: Thought content normal.        Judgment: Judgment normal.       Assessment & Plan:  1. Routine general medical examination at a health care facility Today patient counseled on age appropriate routine health concerns for screening and prevention, each reviewed and up to date or declined. Immunizations reviewed and up to date or declined. Labs ordered and reviewed. Risk factors for depression reviewed and negative. Hearing function and visual acuity are intact. ADLs screened and addressed as needed. Functional ability and level of safety reviewed and appropriate. Education, counseling and referrals performed based on assessed risks today. Patient provided with a copy of  personalized plan for preventive services.   2. Mixed hyperlipidemia - Consider statin  - CBC with Differential/Platelet; Future - Comprehensive metabolic panel; Future - Lipid panel; Future - TSH; Future  3. Primary insomnia - OK to take Xanax PRN   4. Age-related osteoporosis without current pathological fracture - Follow up with GYN  - CBC with Differential/Platelet; Future - Comprehensive metabolic panel; Future - Lipid panel; Future - TSH; Future - VITAMIN D 25 Hydroxy (Vit-D Deficiency, Fractures); Future  5. Anxiety and depression - Follow up if symptoms come back   Dorothyann Peng, NP

## 2022-04-01 NOTE — Patient Instructions (Signed)
It was great seeing you today   We will follow up with you regarding your lab work   Please let me know if you need anything   

## 2022-04-24 IMAGING — MG MM DIGITAL SCREENING BILAT W/ TOMO AND CAD
8 series · 9 of 24 positions shown · non-contrast
Comparison: Previous exam(s).

CLINICAL DATA: Screening.

EXAM:
DIGITAL SCREENING BILATERAL MAMMOGRAM WITH TOMOSYNTHESIS AND CAD
TECHNIQUE: Bilateral screening digital craniocaudal and mediolateral oblique
mammograms were obtained. Bilateral screening digital breast
tomosynthesis was performed. The images were evaluated with
computer-aided detection.

[R MLO synth-2D]
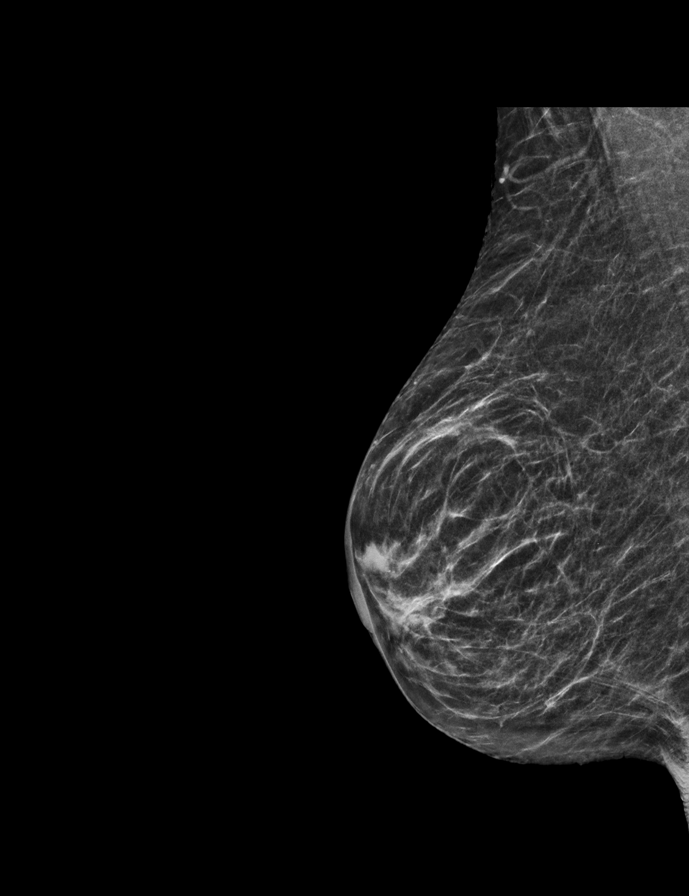

[L CC synth-2D]
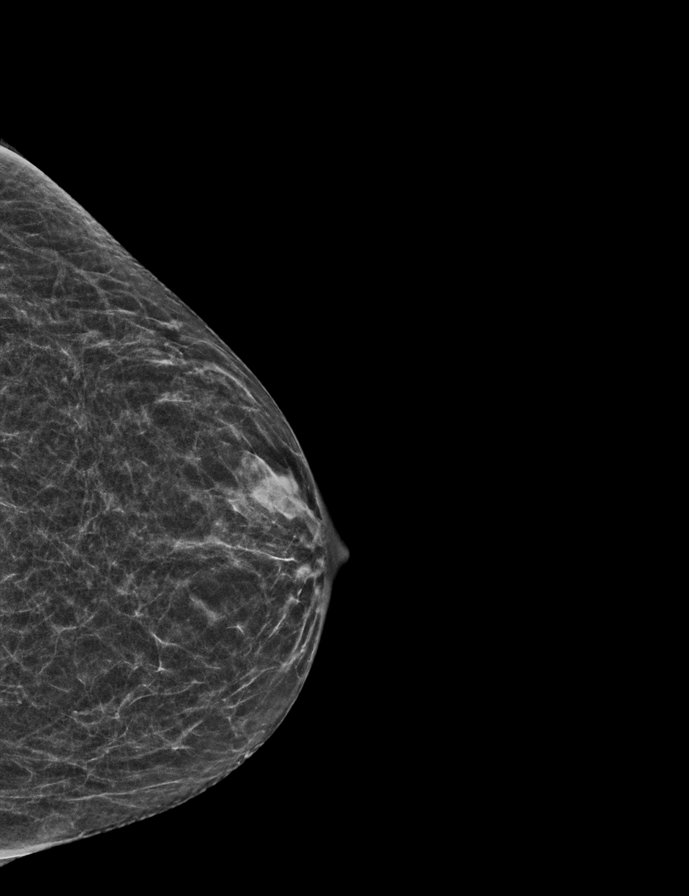

[R CC synth-2D]
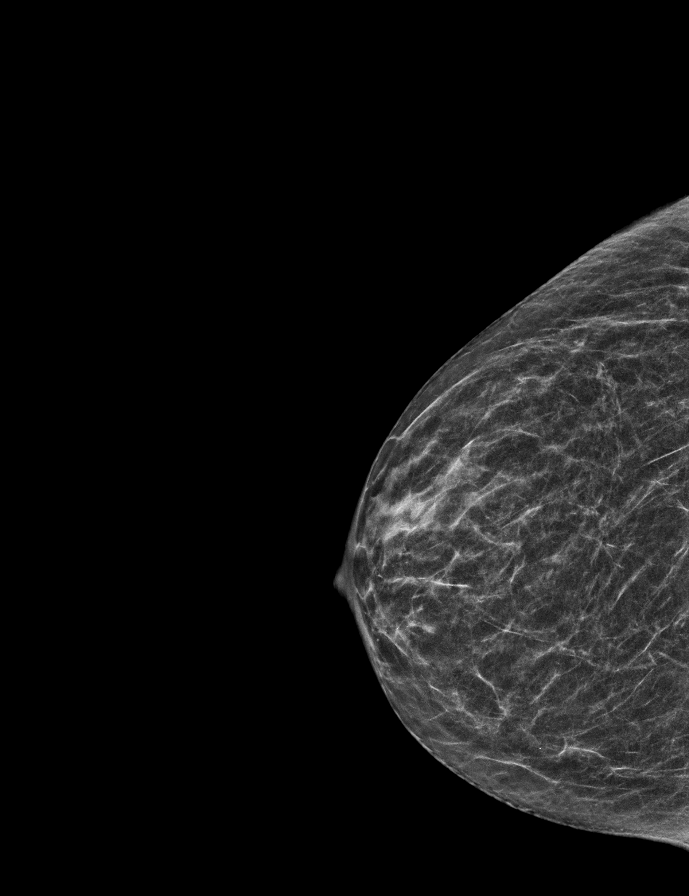

[L MLO synth-2D]
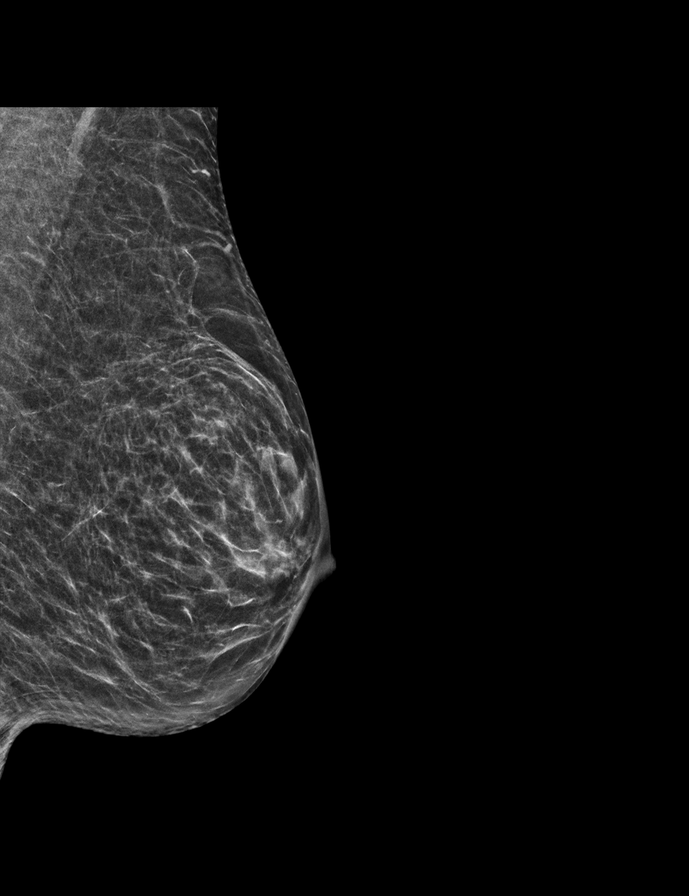

[L MLO tomo · 2 of 44 frames shown]
[frame 15/44]
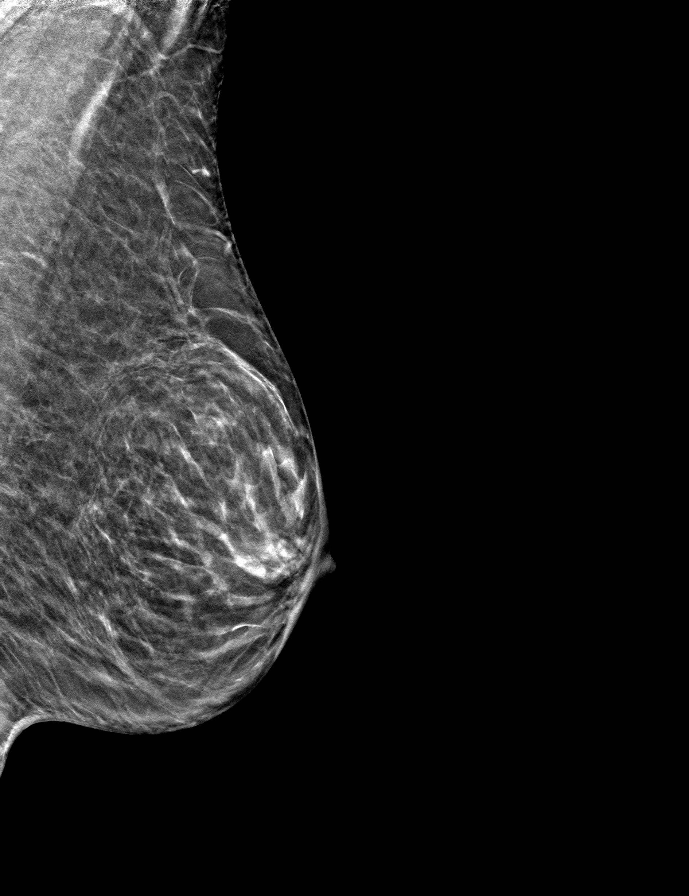
[frame 23/44]
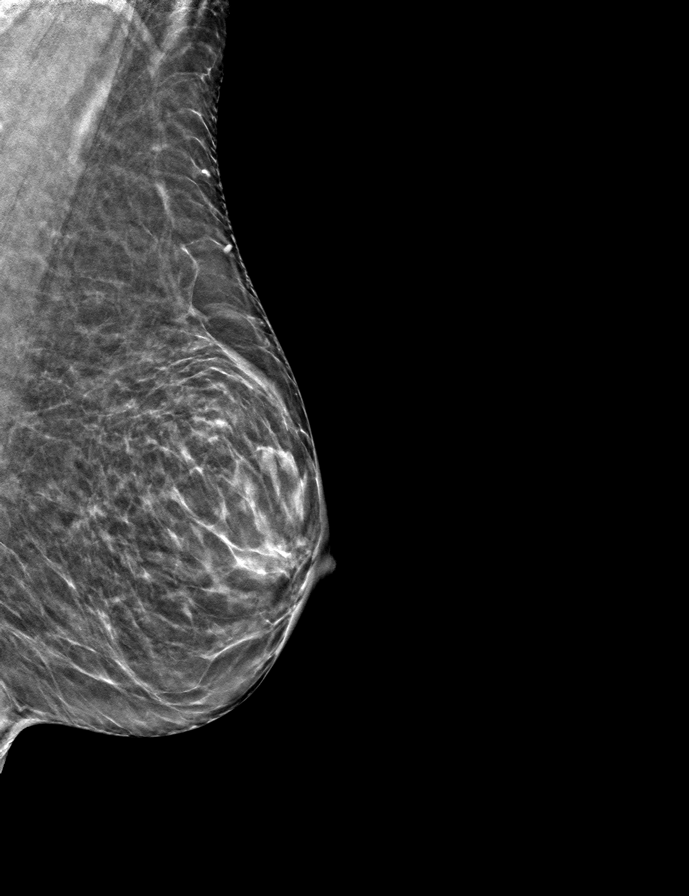

[R CC tomo · tomo slice 22/43.0]
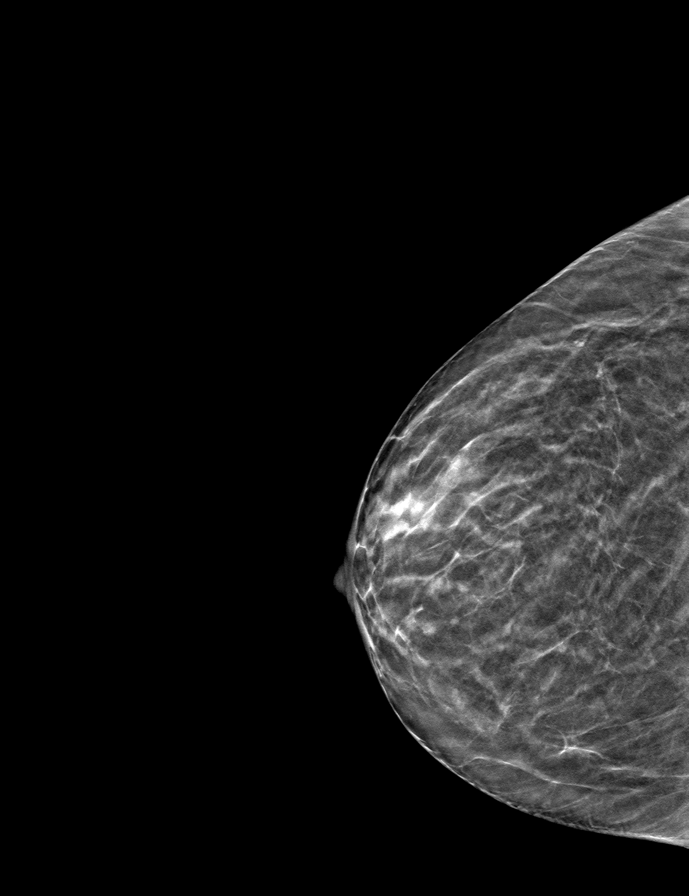

[R MLO tomo · tomo slice 23/46.0]
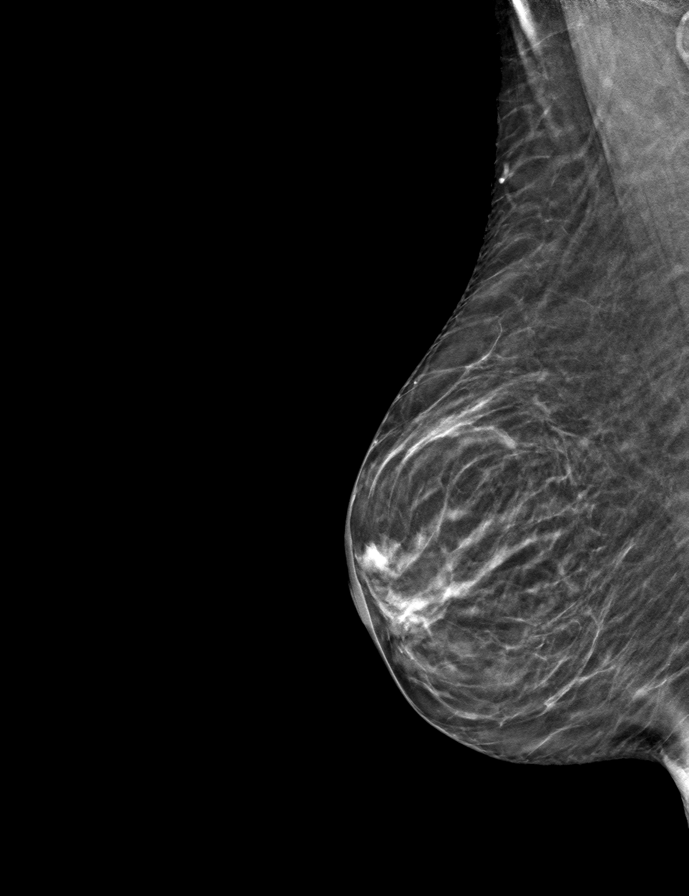

[L CC tomo · tomo slice 21/41.0]
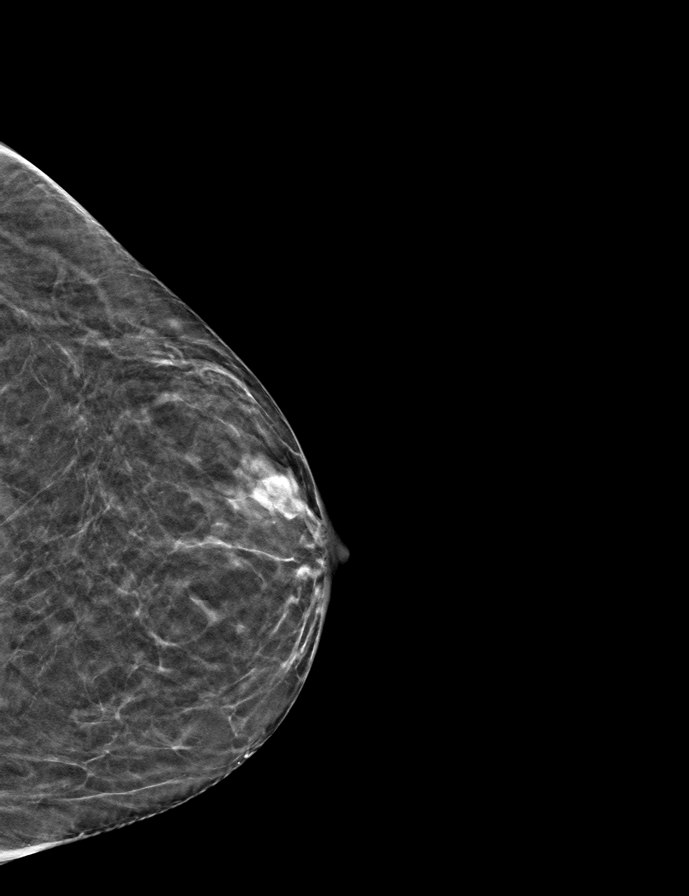

[9 of 24 positions shown; findings below may reference images not displayed]

ACR Breast Density Category b: There are scattered areas of
fibroglandular density.
FINDINGS: There are no findings suspicious for malignancy.
IMPRESSION: No mammographic evidence of malignancy. A result letter of this
screening mammogram will be mailed directly to the patient.

RECOMMENDATION:
Screening mammogram in one year. (Code:51-O-LD2)

BI-RADS CATEGORY  1: Negative.

## 2022-05-04 DIAGNOSIS — Z85828 Personal history of other malignant neoplasm of skin: Secondary | ICD-10-CM | POA: Diagnosis not present

## 2022-05-04 DIAGNOSIS — L814 Other melanin hyperpigmentation: Secondary | ICD-10-CM | POA: Diagnosis not present

## 2022-05-04 DIAGNOSIS — L538 Other specified erythematous conditions: Secondary | ICD-10-CM | POA: Diagnosis not present

## 2022-05-04 DIAGNOSIS — L821 Other seborrheic keratosis: Secondary | ICD-10-CM | POA: Diagnosis not present

## 2022-05-04 DIAGNOSIS — L910 Hypertrophic scar: Secondary | ICD-10-CM | POA: Diagnosis not present

## 2022-05-04 DIAGNOSIS — D1801 Hemangioma of skin and subcutaneous tissue: Secondary | ICD-10-CM | POA: Diagnosis not present

## 2022-05-04 DIAGNOSIS — Z789 Other specified health status: Secondary | ICD-10-CM | POA: Diagnosis not present

## 2022-05-04 DIAGNOSIS — L820 Inflamed seborrheic keratosis: Secondary | ICD-10-CM | POA: Diagnosis not present

## 2022-05-04 DIAGNOSIS — Z08 Encounter for follow-up examination after completed treatment for malignant neoplasm: Secondary | ICD-10-CM | POA: Diagnosis not present

## 2022-05-04 DIAGNOSIS — L298 Other pruritus: Secondary | ICD-10-CM | POA: Diagnosis not present

## 2022-05-18 ENCOUNTER — Ambulatory Visit (INDEPENDENT_AMBULATORY_CARE_PROVIDER_SITE_OTHER): Payer: Medicare HMO | Admitting: Obstetrics & Gynecology

## 2022-05-18 ENCOUNTER — Other Ambulatory Visit (HOSPITAL_COMMUNITY)
Admission: RE | Admit: 2022-05-18 | Discharge: 2022-05-18 | Disposition: A | Discharge status: Home / Self Care | Payer: Medicare HMO | Source: Ambulatory Visit | Attending: Obstetrics & Gynecology | Admitting: Obstetrics & Gynecology

## 2022-05-18 ENCOUNTER — Encounter (HOSPITAL_BASED_OUTPATIENT_CLINIC_OR_DEPARTMENT_OTHER): Payer: Self-pay | Admitting: Obstetrics & Gynecology

## 2022-05-18 VITALS — BP 132/82 | HR 63 | Ht 59.0 in | Wt 107.8 lb

## 2022-05-18 DIAGNOSIS — Z124 Encounter for screening for malignant neoplasm of cervix: Secondary | ICD-10-CM

## 2022-05-18 DIAGNOSIS — Z1151 Encounter for screening for human papillomavirus (HPV): Secondary | ICD-10-CM | POA: Insufficient documentation

## 2022-05-18 DIAGNOSIS — H353130 Nonexudative age-related macular degeneration, bilateral, stage unspecified: Secondary | ICD-10-CM

## 2022-05-18 DIAGNOSIS — Z9189 Other specified personal risk factors, not elsewhere classified: Secondary | ICD-10-CM | POA: Diagnosis not present

## 2022-05-18 DIAGNOSIS — R87610 Atypical squamous cells of undetermined significance on cytologic smear of cervix (ASC-US): Secondary | ICD-10-CM | POA: Insufficient documentation

## 2022-05-18 DIAGNOSIS — B977 Papillomavirus as the cause of diseases classified elsewhere: Secondary | ICD-10-CM

## 2022-05-18 DIAGNOSIS — R87810 Cervical high risk human papillomavirus (HPV) DNA test positive: Secondary | ICD-10-CM | POA: Insufficient documentation

## 2022-05-18 DIAGNOSIS — M810 Age-related osteoporosis without current pathological fracture: Secondary | ICD-10-CM

## 2022-05-18 DIAGNOSIS — Z01411 Encounter for gynecological examination (general) (routine) with abnormal findings: Secondary | ICD-10-CM | POA: Insufficient documentation

## 2022-05-18 MED ORDER — PRESERVISION AREDS 2 PO CAPS: 1.0000 | ORAL_CAPSULE | Freq: Every day | ORAL | Status: AC

## 2022-05-18 NOTE — Progress Notes (Unsigned)
72 y.o. G62P3003 Widowed White or Caucasian female here for breast and pelvic exam.  I am also following her for h/o HR HPV and high risk subtype 16 positive testing.  Not SA.  Denies vaginal bleeding.  Does still express worry and frustration with +HPV results and how much this interferes with her ability to be in relationships.  Reports macular degeneration diagnosis since last was seen.  Patient's last menstrual period was 01/26/2002 (approximate).          Sexually active: No.  H/O   Health Maintenance: PCP:  Angela Adam.  Last wellness appt was 03/2022.  Did blood work at that appt: yes Vaccines are up to date:  discussed RSV  Colonoscopy:  04/26/2013, follow up 10 years MMG:  11/24/2021 Negative BMD:  10/20/2019 Osteoporosis Last pap smear:  05/07/2020 Positive HPV and HPV 16.      reports that she has never smoked. She has never used smokeless tobacco. She reports current alcohol use of about 7.0 standard drinks of alcohol per week. She reports that she does not use drugs.  Past Medical History:  Diagnosis Date   Atypical mole 03/14/2004   Mid Upper Buttocks (slight)   Atypical mole 03/01/2013   Right Shoulder (mild)   Atypical mole 03/01/2013   Left Back (mild)   Depression    Hyperlipidemia     Past Surgical History:  Procedure Laterality Date   BELPHAROPTOSIS REPAIR     COLONOSCOPY     FOOT SURGERY  01/15/2012   right   FOOT SURGERY  July 2014   right   KNEE ARTHROSCOPY Right    age 70   LIPOSUCTION  5/05    Current Outpatient Medications  Medication Sig Dispense Refill   ALPRAZolam (XANAX) 0.5 MG tablet Take 1 tablet (0.5 mg total) by mouth at bedtime as needed for anxiety. 15 tablet 0   Calcium 200 MG TABS Calcium     Cyanocobalamin (B-12 PO) Take 1,000 mcg by mouth.     fluticasone (FLONASE) 50 MCG/ACT nasal spray SPRAY 2 SPRAYS INTO EACH NOSTRIL EVERY DAY 48 mL 1   FOLIC ACID PO Take 800 mg by mouth.      Multiple Vitamins-Minerals (PRESERVISION AREDS  2) CAPS Take 1 tablet by mouth daily in the afternoon.     NON FORMULARY Protandim Synergizer - take one caplet by mouth daily     OVER THE COUNTER MEDICATION Natural D-Hist     Probiotic Product (PROBIOTIC PO) Take by mouth. Women's Probiotic - Garden of Eden     No current facility-administered medications for this visit.    Family History  Problem Relation Age of Onset   Diabetes Father    Hypertension Father    Heart disease Father        CABG   Cancer Mother 53       cancer unknown primary   COPD Mother    Breast cancer Paternal Aunt    Colon cancer Neg Hx     Review of Systems  Constitutional: Negative.   Genitourinary: Negative.     Exam:   BP 132/82   Pulse 63   Ht  (1.499 m) Comment: Reported  Wt 107 lb 12.8 oz (48.9 kg)   LMP 01/26/2002 (Approximate)   BMI 21.77 kg/m   Height:  (149.9 cm) (Reported)  General appearance: alert, cooperative and appears stated age Breasts: normal appearance, no masses or tenderness Abdomen: soft, non-tender; bowel sounds normal; no masses,  no  organomegaly Lymph nodes: Cervical, supraclavicular, and axillary nodes normal.  No abnormal inguinal nodes palpated Neurologic: Grossly normal  Pelvic: External genitalia:  no lesions              Urethra:  normal appearing urethra with no masses, tenderness or lesions              Bartholins and Skenes: normal                 Vagina: normal appearing vagina with atrophic changes and no discharge, no lesions              Cervix: no lesions              Pap taken: Yes.   Bimanual Exam:  Uterus:  normal size, contour, position, consistency, mobility, non-tender              Adnexa: normal adnexa and no mass, fullness, tenderness               Rectovaginal: Confirms               Anus:  normal sphincter tone, no lesions  Jesusita Oka, CMA, was present for exam.  Assessment/Plan: 1. GYN exam for high-risk Medicare patient - Pap smear with HR HPV obtained today -  Mammogram 11/24/2021 - Colonoscopy 04/26/2013, follow up 10 years29/24/2021 - Bone mineral density 2021 - lab work done with PCP, Angela Adam - vaccines reviewed/updated  2. Cervical cancer screening - Cytology - PAP( [place])  3. High risk HPV infection)  4. Age-related osteoporosis without current pathological fracture - will plan to repeat this year.    5. Bilateral nonexudative age-related macular degeneration, unspecified stage

## 2022-05-20 ENCOUNTER — Other Ambulatory Visit: Payer: Self-pay | Admitting: Adult Health

## 2022-05-20 ENCOUNTER — Encounter (HOSPITAL_BASED_OUTPATIENT_CLINIC_OR_DEPARTMENT_OTHER): Payer: Self-pay | Admitting: Obstetrics & Gynecology

## 2022-05-20 DIAGNOSIS — J309 Allergic rhinitis, unspecified: Secondary | ICD-10-CM

## 2022-05-22 LAB — CYTOLOGY - PAP
Comment: NEGATIVE
Comment: NEGATIVE
Comment: NEGATIVE
Diagnosis: UNDETERMINED — AB
HPV 16: POSITIVE — AB
HPV 18 / 45: NEGATIVE
High risk HPV: POSITIVE — AB

## 2022-06-02 ENCOUNTER — Other Ambulatory Visit (HOSPITAL_COMMUNITY)
Admission: RE | Admit: 2022-06-02 | Discharge: 2022-06-02 | Disposition: A | Discharge status: Home / Self Care | Payer: Medicare HMO | Source: Ambulatory Visit | Attending: Obstetrics & Gynecology | Admitting: Obstetrics & Gynecology

## 2022-06-02 ENCOUNTER — Ambulatory Visit (HOSPITAL_BASED_OUTPATIENT_CLINIC_OR_DEPARTMENT_OTHER): Payer: Medicare HMO | Admitting: Obstetrics & Gynecology

## 2022-06-02 ENCOUNTER — Encounter (HOSPITAL_BASED_OUTPATIENT_CLINIC_OR_DEPARTMENT_OTHER): Payer: Self-pay | Admitting: Obstetrics & Gynecology

## 2022-06-02 VITALS — HR 70 | Ht 59.0 in | Wt 106.2 lb

## 2022-06-02 DIAGNOSIS — R87810 Cervical high risk human papillomavirus (HPV) DNA test positive: Secondary | ICD-10-CM | POA: Diagnosis not present

## 2022-06-02 DIAGNOSIS — R87610 Atypical squamous cells of undetermined significance on cytologic smear of cervix (ASC-US): Secondary | ICD-10-CM | POA: Insufficient documentation

## 2022-06-02 NOTE — Progress Notes (Signed)
72 y.o. G84P3003 Widowed Not Hispanic or Latino female here for colposcopy with possible biopsies and/or ECC due to ASCUS Pap with high risk HPV obtained 05/18/2022.  She denies vaginal bleeding.     Pt has questions about HPV.  We have discussed these previously but discussed again today.  Patient's last menstrual period was 01/26/2002 (approximate).          Sexually active: No.  The current method of family planning is post menopausal status.     Patient has been counseled about results and procedure.  Risks and benefits have bene reviewed including immediate and/or delayed bleeding, infection, cervical scaring from procedure, possibility of needing additional follow up as well as treatment.  Rare risks of missing a lesion discussed as well.  All questions answered.  Pt ready to proceed.  Consent obtained.  Pulse 70   Ht 4\' 11"  (1.499 m) Comment: Reported  Wt 106 lb 3.2 oz (48.2 kg)   LMP 01/26/2002 (Approximate)   SpO2 100%   BMI 21.45 kg/m   General appearance: alert, cooperative and appears stated age Lymph nodes: No abnormal inguinal nodes palpated Neurologic: Grossly normal  Pelvic: External genitalia:  no lesions              Urethra:  normal appearing urethra with no masses, tenderness or lesions              Bartholins and Skenes: normal                 Vagina: normal appearing vagina with normal color and no discharge, no lesions               Physical Exam Constitutional:      Appearance: Normal appearance.  Genitourinary:    Neurological:     Mental Status: She is alert.     Speculum placed.  3% acetic acid applied to cervix for >45 seconds.  Cervix visualized with both 7.5X and 15X magnification.  Green filter also used.  Lugols solution was not used.  Findings:  small area of AWE on cervix although this doesn't look like dysplasia and is more consistent with atrophic changes.  Biopsy:  12 o'clock.  ECC:  was performed.  Monsel's was not needed.  Excellent  hemostasis was present.  Pt tolerated procedure well and all instruments were removed.  Findings noted above on picture of cervix.  Chaperone, Ina Homes, was present during procedure.  Assessment/Plan: 1. ASCUS with positive high risk HPV cervical - Surgical pathology( [place]/ [facility])  - Pathology results will be called to patient and follow-up planned pending results.

## 2022-06-04 LAB — SURGICAL PATHOLOGY

## 2022-06-08 ENCOUNTER — Telehealth: Payer: Self-pay | Admitting: Adult Health

## 2022-06-08 DIAGNOSIS — F32A Depression, unspecified: Secondary | ICD-10-CM

## 2022-06-08 NOTE — Telephone Encounter (Signed)
Prescription Request  06/08/2022  LOV: 04/01/2022  What is the name of the medication or equipment? ALPRAZolam (XANAX) 0.5 MG tablet . Pt would like to take medication daily  Which pharmacy would you like this sent to?  [id number] - [place], [place] - [address] AT CORNER OF [facility] [address] [place] [address] Phone: [phone] Fax: [phone]     Patient notified that their request is being sent to the clinical staff for review and that they should receive a response within 2 business days.   Please advise at Mobile [phone] (mobile)

## 2022-06-09 ENCOUNTER — Encounter (HOSPITAL_BASED_OUTPATIENT_CLINIC_OR_DEPARTMENT_OTHER): Payer: Self-pay | Admitting: Obstetrics & Gynecology

## 2022-06-09 ENCOUNTER — Ambulatory Visit (HOSPITAL_BASED_OUTPATIENT_CLINIC_OR_DEPARTMENT_OTHER): Payer: Medicare HMO | Admitting: Obstetrics & Gynecology

## 2022-06-09 ENCOUNTER — Other Ambulatory Visit (HOSPITAL_COMMUNITY)
Admission: RE | Admit: 2022-06-09 | Discharge: 2022-06-09 | Disposition: A | Discharge status: Home / Self Care | Payer: Medicare HMO | Source: Ambulatory Visit | Attending: Obstetrics & Gynecology | Admitting: Obstetrics & Gynecology

## 2022-06-09 VITALS — Ht 59.0 in | Wt 104.2 lb

## 2022-06-09 DIAGNOSIS — B977 Papillomavirus as the cause of diseases classified elsewhere: Secondary | ICD-10-CM

## 2022-06-09 DIAGNOSIS — R87810 Cervical high risk human papillomavirus (HPV) DNA test positive: Secondary | ICD-10-CM | POA: Diagnosis not present

## 2022-06-09 DIAGNOSIS — N879 Dysplasia of cervix uteri, unspecified: Secondary | ICD-10-CM

## 2022-06-09 MED ORDER — ALPRAZOLAM 0.5 MG PO TABS
0.5000 mg | ORAL_TABLET | Freq: Every evening | ORAL | 0 refills | Status: DC | PRN
Start: 2022-06-09 — End: 2022-07-23

## 2022-06-09 NOTE — Telephone Encounter (Signed)
Okay for refill?  

## 2022-06-09 NOTE — Progress Notes (Signed)
72 y.o. Denise Bradley Widowed WF here for LEEP due to [facility] noted with colposcopy performed 06/01/2021.    Patient's last menstrual period was 01/26/2002 (approximate).          Sexually active: No.  The current method of family planning is post menopausal status.     Pre-procedure vitals: Height 4\' 11"  (1.499 m), weight 104 lb 3.2 oz (47.3 kg), last menstrual period 01/26/2002.   Procedure explained and patient's questions were invited and answered.   Consent form signed.  Pre-procedure medication:  none  Procedure Set-up: Grounding pad located right thigh.  Cautery settings: 45 blend.  Suction applied to coated speculum.  Procedure:  Speculum placed with good visualization of the cervix.  Colposcopy performed showing:  no visible lesions.  Cervix anesthetized using 2% Xylocaine with 1:100,000units Epinephrine.  7 cc's used.  Entire transition zone excised with 10 x 15 loop in 4 passes.  ECC obtained above the LEEP specimen.  Specimen(s) placed on cork and labeled for pathology.  Hemostasis obtained with ball cautery and Monsel's solution.  EBL:  Minimal  Complications:  none.  Patient tolerated procedure well and left the office in satisfactory condition.  Assessment/Plan: 1. High risk HPV infection - Surgical pathology( [place]/ [facility])  2. Cervical dysplasia - Surgical pathology( [place]/ [facility]) - Follow up 1 month for recheck - Pathology will be called to pt and follow up Pap smear planned at that time. - Post procedure instructions reviewed including pelvic rest until follow up

## 2022-06-12 LAB — SURGICAL PATHOLOGY

## 2022-06-14 ENCOUNTER — Encounter: Payer: Self-pay | Admitting: Adult Health

## 2022-06-16 NOTE — Telephone Encounter (Signed)
Please advise 

## 2022-06-23 ENCOUNTER — Encounter: Payer: Self-pay | Admitting: Adult Health

## 2022-06-23 ENCOUNTER — Ambulatory Visit (INDEPENDENT_AMBULATORY_CARE_PROVIDER_SITE_OTHER): Payer: Medicare HMO | Admitting: Adult Health

## 2022-06-23 VITALS — BP 164/78 | HR 75 | Temp 98.0°F | Ht 59.0 in | Wt 103.6 lb

## 2022-06-23 DIAGNOSIS — B977 Papillomavirus as the cause of diseases classified elsewhere: Secondary | ICD-10-CM

## 2022-06-23 DIAGNOSIS — F32A Depression, unspecified: Secondary | ICD-10-CM

## 2022-06-23 DIAGNOSIS — F419 Anxiety disorder, unspecified: Secondary | ICD-10-CM

## 2022-06-23 DIAGNOSIS — H3530 Unspecified macular degeneration: Secondary | ICD-10-CM | POA: Diagnosis not present

## 2022-06-23 LAB — IBC + FERRITIN
Ferritin: 80.2 ng/mL (ref 10.0–291.0)
Iron: 60 ug/dL (ref 42–145)
Saturation Ratios: 18.2 % — ABNORMAL LOW (ref 20.0–50.0)
TIBC: 329 ug/dL (ref 250.0–450.0)
Transferrin: 235 mg/dL (ref 212.0–360.0)

## 2022-06-23 LAB — VITAMIN B12: Vitamin B-12: 1157 pg/mL — ABNORMAL HIGH (ref 211–911)

## 2022-06-23 LAB — FOLATE: Folate: 19.5 ng/mL (ref >5.9)

## 2022-06-23 MED ORDER — BUPROPION HCL ER (XL) 150 MG PO TB24
150.0000 mg | ORAL_TABLET | Freq: Every day | ORAL | 0 refills | Status: DC
Start: 2022-06-23 — End: 2022-09-15

## 2022-06-23 NOTE — Progress Notes (Signed)
Subjective:    Patient ID: Denise Bradley, female    DOB: [birth year], 72 y.o.   MRN: [id number]  HPI 72 year old female who  has a past medical history of Atypical mole (03/14/2004), Atypical mole (03/01/2013), Atypical mole (03/01/2013), Depression, Hyperlipidemia, and Macular degeneration.  She has a history of HPV.  Was recently seen by GYN and had colposcopy performed showing no visible lesions.  Her pathology report showed mostly attenuated cervical mucosa and displaced thick, detached squamous fragments.  Benign endocervical mucosa.  She feels as though this diagnosis has started to affect her negatively due to having to have a procedure to remove and biopsy abnormal cells of her cervix.She wasn't sto  Her daughter did some research about labs to have done to make sure her immune system is functioning properly because she has not been able to have her body rid of HSV.  She would like to have these labs done today  She also was diagnosed with Macular degeneration  and would like to check her Vitamin A level    She would also like to go back on Wellbutrin to help with anxiety. She reports multiple deaths and life stresses have increased her anxiety    Review of Systems See HPI   Past Medical History:  Diagnosis Date   Atypical mole 03/14/2004   Mid Upper Buttocks (slight)   Atypical mole 03/01/2013   Right Shoulder (mild)   Atypical mole 03/01/2013   Left Back (mild)   Depression    Hyperlipidemia    Macular degeneration     Social History   Socioeconomic History   Marital status: Widowed    Spouse name: Not on file   Number of children: Not on file   Years of education: Not on file   Highest education level: Some college, no degree  Occupational History   Not on file  Tobacco Use   Smoking status: Never   Smokeless tobacco: Never  Vaping Use   Vaping Use: Never used  Substance and Sexual Activity   Alcohol use: Yes    Alcohol/week: 7.0 standard drinks of  alcohol    Types: 7 Glasses of wine per week   Drug use: No   Sexual activity: Not Currently    Birth control/protection: Post-menopausal  Other Topics Concern   Not on file  Social History Narrative   Retired - Worked with [place] and last May she was laid off. She worked there for 22 years   Going back to school for [employer].    No pets   Three children ( [place], [place]. [place] ( Son and two daughters- all married. & grandchildren)   States " Does not do anything fun".          Diet: Eat healthy, does not eat fast food.    Exercise: Does not exercise, does not feel like she has time.       Social Determinants of Health   Financial Resource Strain: Low Risk  (06/22/2022)   Overall Financial Resource Strain (CARDIA)    Difficulty of Paying Living Expenses: Not hard at all  Food Insecurity: No Food Insecurity (06/22/2022)   Hunger Vital Sign    Worried About Running Out of Food in the Last Year: Never true    Ran Out of Food in the Last Year: Never true  Transportation Needs: No Transportation Needs (06/22/2022)   PRAPARE - Transportation    Lack of Transportation (Medical): No    Lack of  Transportation (Non-Medical): No  Physical Activity: Sufficiently Active (06/22/2022)   Exercise Vital Sign    Days of Exercise per Week: 3 days    Minutes of Exercise per Session: 60 min  Stress: Stress Concern Present (06/22/2022)   [employer] of Occupational Health - Occupational Stress Questionnaire    Feeling of Stress : Very much  Social Connections: Unknown (06/22/2022)   Social Connection and Isolation Panel [NHANES]    Frequency of Communication with Friends and Family: More than three times a week    Frequency of Social Gatherings with Friends and Family: More than three times a week    Attends Religious Services: More than 4 times per year    Active Member of [employer] or Organizations: Patient declined    Attends Banker Meetings: Not on file    Marital  Status: Widowed  Intimate Partner Violence: Not on file    Past Surgical History:  Procedure Laterality Date   BELPHAROPTOSIS REPAIR     COLONOSCOPY     FOOT SURGERY  01/15/2012   right   FOOT SURGERY  July 2014   right   KNEE ARTHROSCOPY Right    age 50   LIPOSUCTION  5/05    Family History  Problem Relation Age of Onset   Diabetes Father    Hypertension Father    Heart disease Father        CABG   Cancer Mother 35       cancer unknown primary   COPD Mother    Breast cancer Paternal Aunt    Colon cancer Neg Hx     Allergies  Allergen Reactions   Acetaminophen Anaphylaxis    REACTION: swells throat shut/can't breathe   Clindamycin/Lincomycin Other (See Comments)    Cause Loose Stools// Diarrhea    Current Outpatient Medications on File Prior to Visit  Medication Sig Dispense Refill   ALPRAZolam (XANAX) 0.5 MG tablet Take 1 tablet (0.5 mg total) by mouth at bedtime as needed for anxiety. 15 tablet 0   Calcium 200 MG TABS Calcium     Cyanocobalamin (B-12 PO) Take 1,000 mcg by mouth.     fluticasone (FLONASE) 50 MCG/ACT nasal spray SPRAY 2 SPRAYS INTO EACH NOSTRIL EVERY DAY 48 mL 1   FOLIC ACID PO Take 800 mg by mouth.      Multiple Vitamins-Minerals (PRESERVISION AREDS 2) CAPS Take 1 tablet by mouth daily in the afternoon.     NON FORMULARY Protandim Synergizer - take one caplet by mouth daily     OVER THE COUNTER MEDICATION Natural D-Hist     Probiotic Product (PROBIOTIC PO) Take by mouth. Women's Probiotic - [place]     No current facility-administered medications on file prior to visit.    BP (!) 164/78 (BP Location: Left Arm, Patient Position: Sitting, Cuff Size: Normal)   Pulse 75   Temp 98 F (36.7 C) (Oral)   Ht 4\' 11"  (1.499 m)   Wt 103 lb 9.6 oz (47 kg)   LMP 01/26/2002 (Approximate)   SpO2 98%   BMI 20.92 kg/m       Objective:   Physical Exam Vitals and nursing note reviewed.  Constitutional:      Appearance: Normal appearance.   Cardiovascular:     Rate and Rhythm: Normal rate and regular rhythm.     Pulses: Normal pulses.     Heart sounds: Normal heart sounds.  Neurological:     General: No focal deficit present.  Mental Status: She is alert and oriented to person, place, and time.  Psychiatric:        Mood and Affect: Mood normal.        Behavior: Behavior normal.        Thought Content: Thought content normal.        Judgment: Judgment normal.        Assessment & Plan:  1. High risk HPV infection - Advised that not everyone is able to rid themselves of the HSV virus. I will check her labs at her request. I cannot promise that insurance will pay for these lab tests.  - Vitamin B12; Future - Homocysteine; Future - IBC + Ferritin; Future - Folate; Future  2. Macular degeneration, unspecified laterality, unspecified type  - Vitamin A; Future  3. Anxiety and depression - Place back on Wellbutrin 150 mg daily  - Follow up in 30 days  - buPROPion (WELLBUTRIN XL) 150 MG 24 hr tablet; Take 1 tablet (150 mg total) by mouth daily.  Dispense: 90 tablet; Refill: 0  Shirline Frees, NP  Time spent with patient today was 33 minutes which consisted of chart review, discussing diagnosis, work up, treatment answering questions and documentation.

## 2022-06-26 LAB — VITAMIN A: Vitamin A (Retinoic Acid): 57 ug/dL (ref 38–98)

## 2022-06-26 LAB — HOMOCYSTEINE: Homocysteine: 10.8 umol/L — ABNORMAL HIGH (ref <10.4)

## 2022-07-01 DIAGNOSIS — L814 Other melanin hyperpigmentation: Secondary | ICD-10-CM | POA: Diagnosis not present

## 2022-07-01 DIAGNOSIS — D2372 Other benign neoplasm of skin of left lower limb, including hip: Secondary | ICD-10-CM | POA: Diagnosis not present

## 2022-07-01 DIAGNOSIS — D1801 Hemangioma of skin and subcutaneous tissue: Secondary | ICD-10-CM | POA: Diagnosis not present

## 2022-07-01 DIAGNOSIS — L570 Actinic keratosis: Secondary | ICD-10-CM | POA: Diagnosis not present

## 2022-07-01 DIAGNOSIS — L853 Xerosis cutis: Secondary | ICD-10-CM | POA: Diagnosis not present

## 2022-07-09 ENCOUNTER — Encounter (HOSPITAL_BASED_OUTPATIENT_CLINIC_OR_DEPARTMENT_OTHER): Payer: Self-pay | Admitting: Obstetrics & Gynecology

## 2022-07-09 ENCOUNTER — Ambulatory Visit (HOSPITAL_BASED_OUTPATIENT_CLINIC_OR_DEPARTMENT_OTHER): Payer: Medicare HMO | Admitting: Obstetrics & Gynecology

## 2022-07-09 VITALS — BP 150/82 | Ht 59.0 in | Wt 102.0 lb

## 2022-07-09 DIAGNOSIS — B977 Papillomavirus as the cause of diseases classified elsewhere: Secondary | ICD-10-CM

## 2022-07-09 DIAGNOSIS — Z9889 Other specified postprocedural states: Secondary | ICD-10-CM

## 2022-07-09 NOTE — Progress Notes (Signed)
GYNECOLOGY  VISIT  CC:   follow up after LEEP  HPI: 72 y.o. G3P3003 Widowed White or Caucasian female here for follow after having LEEP procedure done 06/09/2022.  Reports had some post procedure bleeding.  She did have some discharge but this stopped about a week ago.  Pathology reviewed.  She does want to have a repeat pap smear in 6 months and then again in a year.  She has questions about HPV clearance related to pap smear.  Guidelines for follow up discussed.     Past Medical History:  Diagnosis Date   Atypical mole 03/14/2004   Mid Upper Buttocks (slight)   Atypical mole 03/01/2013   Right Shoulder (mild)   Atypical mole 03/01/2013   Left Back (mild)   Depression    Hyperlipidemia    Macular degeneration     MEDS:   Current Outpatient Medications on File Prior to Visit  Medication Sig Dispense Refill   ALPRAZolam (XANAX) 0.5 MG tablet Take 1 tablet (0.5 mg total) by mouth at bedtime as needed for anxiety. 15 tablet 0   buPROPion (WELLBUTRIN XL) 150 MG 24 hr tablet Take 1 tablet (150 mg total) by mouth daily. 90 tablet 0   Calcium 200 MG TABS Calcium     Cyanocobalamin (B-12 PO) Take 1,000 mcg by mouth.     fluticasone (FLONASE) 50 MCG/ACT nasal spray SPRAY 2 SPRAYS INTO EACH NOSTRIL EVERY DAY 48 mL 1   FOLIC ACID PO Take 800 mg by mouth.      Multiple Vitamins-Minerals (PRESERVISION AREDS 2) CAPS Take 1 tablet by mouth daily in the afternoon.     NON FORMULARY Protandim Synergizer - take one caplet by mouth daily     OVER THE COUNTER MEDICATION Natural D-Hist     Probiotic Product (PROBIOTIC PO) Take by mouth. Women's Probiotic - [place]     No current facility-administered medications on file prior to visit.    ALLERGIES: Acetaminophen and Clindamycin/lincomycin  SH:  widowed,  non smoker  Review of Systems  Constitutional: Negative.   Genitourinary: Negative.     PHYSICAL EXAMINATION:    BP (!) 150/82   Ht 4\' 11"  (1.499 m) Comment: Reported  Wt 102 lb  (46.3 kg)   LMP 01/26/2002 (Approximate)   BMI 20.60 kg/m     General appearance: alert, cooperative and appears stated age   Assessment/Plan: 1. S/P LEEP - doing well - repeat pap in 6 months  2. High risk HPV infection

## 2022-07-23 ENCOUNTER — Encounter: Payer: Self-pay | Admitting: Adult Health

## 2022-07-23 ENCOUNTER — Ambulatory Visit (INDEPENDENT_AMBULATORY_CARE_PROVIDER_SITE_OTHER): Payer: Medicare HMO | Admitting: Adult Health

## 2022-07-23 VITALS — BP 150/70 | HR 70 | Temp 98.1°F | Ht 59.0 in | Wt 103.0 lb

## 2022-07-23 DIAGNOSIS — F419 Anxiety disorder, unspecified: Secondary | ICD-10-CM

## 2022-07-23 DIAGNOSIS — R030 Elevated blood-pressure reading, without diagnosis of hypertension: Secondary | ICD-10-CM

## 2022-07-23 DIAGNOSIS — E538 Deficiency of other specified B group vitamins: Secondary | ICD-10-CM

## 2022-07-23 MED ORDER — ALPRAZOLAM 0.25 MG PO TABS
0.2500 mg | ORAL_TABLET | Freq: Every evening | ORAL | 2 refills | Status: AC | PRN
Start: 2022-07-23 — End: ?

## 2022-07-23 NOTE — Progress Notes (Addendum)
Subjective:    Patient ID: Denise Bradley, female    DOB: Dec 23, 1950, 72 y.o.   MRN: [id number]  HPI 72 year old female who  has a past medical history of Atypical mole (03/14/2004), Atypical mole (03/01/2013), Atypical mole (03/01/2013), Depression, Hyperlipidemia, and Macular degeneration.  She presents to the office today for follow-up to discuss recent lab work.  She also needs a new prescription for Xanax that she takes as needed for anxiety and insomnia.  She is currently prescribed 0.5 mg but finds that when she tries to split it in half that the pill crumbles and the pill splitter.   Review of Systems See HPI   Past Medical History:  Diagnosis Date   Atypical mole 03/14/2004   Mid Upper Buttocks (slight)   Atypical mole 03/01/2013   Right Shoulder (mild)   Atypical mole 03/01/2013   Left Back (mild)   Depression    Hyperlipidemia    Macular degeneration     Social History   Socioeconomic History   Marital status: Widowed    Spouse name: Not on file   Number of children: Not on file   Years of education: Not on file   Highest education level: Some college, no degree  Occupational History   Not on file  Tobacco Use   Smoking status: Never   Smokeless tobacco: Never  Vaping Use   Vaping Use: Never used  Substance and Sexual Activity   Alcohol use: Yes    Alcohol/week: 7.0 standard drinks of alcohol    Types: 7 Glasses of wine per week   Drug use: No   Sexual activity: Not Currently    Birth control/protection: Post-menopausal  Other Topics Concern   Not on file  Social History Narrative   Retired - Worked with [place] and last May she was laid off. She worked there for 22 years   Going back to school for [employer].    No pets   Three children ( [place], [place]. [place] ( Son and two daughters- all married. & grandchildren)   States " Does not do anything fun".          Diet: Eat healthy, does not eat fast food.    Exercise: Does not  exercise, does not feel like she has time.       Social Determinants of Health   Financial Resource Strain: Low Risk  (06/22/2022)   Overall Financial Resource Strain (CARDIA)    Difficulty of Paying Living Expenses: Not hard at all  Food Insecurity: No Food Insecurity (06/22/2022)   Hunger Vital Sign    Worried About Running Out of Food in the Last Year: Never true    Ran Out of Food in the Last Year: Never true  Transportation Needs: No Transportation Needs (06/22/2022)   PRAPARE - Administrator, Civil Service (Medical): No    Lack of Transportation (Non-Medical): No  Physical Activity: Sufficiently Active (06/22/2022)   Exercise Vital Sign    Days of Exercise per Week: 3 days    Minutes of Exercise per Session: 60 min  Stress: Stress Concern Present (06/22/2022)   [employer] of Occupational Health - Occupational Stress Questionnaire    Feeling of Stress : Very much  Social Connections: Unknown (06/22/2022)   Social Connection and Isolation Panel [NHANES]    Frequency of Communication with Friends and Family: More than three times a week    Frequency of Social Gatherings with Friends and Family: More than  three times a week    Attends Religious Services: More than 4 times per year    Active Member of Clubs or Organizations: Patient declined    Attends Banker Meetings: Not on file    Marital Status: Widowed  Intimate Partner Violence: Not on file    Past Surgical History:  Procedure Laterality Date   BELPHAROPTOSIS REPAIR     COLONOSCOPY     FOOT SURGERY  01/15/2012   right   FOOT SURGERY  July 2014   right   KNEE ARTHROSCOPY Right    age 76   LIPOSUCTION  5/05    Family History  Problem Relation Age of Onset   Diabetes Father    Hypertension Father    Heart disease Father        CABG   Cancer Mother 61       cancer unknown primary   COPD Mother    Breast cancer Paternal Aunt    Colon cancer Neg Hx     Allergies  Allergen  Reactions   Acetaminophen Anaphylaxis    REACTION: swells throat shut/can't breathe   Clindamycin/Lincomycin Other (See Comments)    Cause Loose Stools// Diarrhea    Current Outpatient Medications on File Prior to Visit  Medication Sig Dispense Refill   ALPRAZolam (XANAX) 0.5 MG tablet Take 1 tablet (0.5 mg total) by mouth at bedtime as needed for anxiety. 15 tablet 0   buPROPion (WELLBUTRIN XL) 150 MG 24 hr tablet Take 1 tablet (150 mg total) by mouth daily. 90 tablet 0   Calcium 200 MG TABS Calcium     Cyanocobalamin (B-12 PO) Take 1,000 mcg by mouth.     fluticasone (FLONASE) 50 MCG/ACT nasal spray SPRAY 2 SPRAYS INTO EACH NOSTRIL EVERY DAY 48 mL 1   FOLIC ACID PO Take 800 mg by mouth.      Multiple Vitamins-Minerals (PRESERVISION AREDS 2) CAPS Take 1 tablet by mouth daily in the afternoon.     NON FORMULARY Protandim Synergizer - take one caplet by mouth daily     OVER THE COUNTER MEDICATION Natural D-Hist     Probiotic Product (PROBIOTIC PO) Take by mouth. Women's Probiotic - [place]     No current facility-administered medications on file prior to visit.    BP (!) 150/70   Pulse 70   Temp 98.1 F (36.7 C) (Oral)   Ht 4\' 11"  (1.499 m)   Wt 103 lb (46.7 kg)   LMP 01/26/2002 (Approximate)   SpO2 98%   BMI 20.80 kg/m       Objective:   Physical Exam Vitals and nursing note reviewed.  Constitutional:      Appearance: Normal appearance.  Musculoskeletal:        General: Normal range of motion.  Skin:    General: Skin is warm and dry.  Neurological:     General: No focal deficit present.     Mental Status: She is alert and oriented to person, place, and time.  Psychiatric:        Mood and Affect: Mood normal.        Behavior: Behavior normal.        Thought Content: Thought content normal.        Judgment: Judgment normal.           Assessment & Plan:  1. Anxiety - She can try using Melatonin at night to help her sleep instead of taking Xanax  -  ALPRAZolam Prudy Feeler)  0.25 MG tablet; Take 1 tablet (0.25 mg total) by mouth at bedtime as needed for anxiety.  Dispense: 30 tablet; Refill: 2  2. B12 deficiency -I reviewed her recent labs with her which showed that her B12 was elevated.  She was advised that she can take her B12 supplement every other day.  3. Elevated blood pressure reading -Blood pressure elevated in the office but within normal limits at home.   Shirline Frees, NP  Time spent with patient today was 32 minutes which consisted of chart review, discussing anxiety and insomnia, as well as reviewing lab work, listening work up, treatment answering questions and documentation.

## 2022-08-25 DIAGNOSIS — H40023 Open angle with borderline findings, high risk, bilateral: Secondary | ICD-10-CM | POA: Diagnosis not present

## 2022-08-25 DIAGNOSIS — H5213 Myopia, bilateral: Secondary | ICD-10-CM | POA: Diagnosis not present

## 2022-08-25 DIAGNOSIS — H2513 Age-related nuclear cataract, bilateral: Secondary | ICD-10-CM | POA: Diagnosis not present

## 2022-09-11 ENCOUNTER — Other Ambulatory Visit: Payer: Self-pay | Admitting: Adult Health

## 2022-09-11 DIAGNOSIS — F32A Depression, unspecified: Secondary | ICD-10-CM

## 2022-10-13 ENCOUNTER — Other Ambulatory Visit: Payer: Self-pay | Admitting: Obstetrics & Gynecology

## 2022-10-13 DIAGNOSIS — Z1231 Encounter for screening mammogram for malignant neoplasm of breast: Secondary | ICD-10-CM

## 2022-11-05 DIAGNOSIS — L821 Other seborrheic keratosis: Secondary | ICD-10-CM | POA: Diagnosis not present

## 2022-11-05 DIAGNOSIS — D225 Melanocytic nevi of trunk: Secondary | ICD-10-CM | POA: Diagnosis not present

## 2022-11-05 DIAGNOSIS — L814 Other melanin hyperpigmentation: Secondary | ICD-10-CM | POA: Diagnosis not present

## 2022-11-05 DIAGNOSIS — Z08 Encounter for follow-up examination after completed treatment for malignant neoplasm: Secondary | ICD-10-CM | POA: Diagnosis not present

## 2022-11-05 DIAGNOSIS — Z85828 Personal history of other malignant neoplasm of skin: Secondary | ICD-10-CM | POA: Diagnosis not present

## 2022-11-17 ENCOUNTER — Other Ambulatory Visit (HOSPITAL_BASED_OUTPATIENT_CLINIC_OR_DEPARTMENT_OTHER): Payer: Self-pay | Admitting: Obstetrics & Gynecology

## 2022-11-17 DIAGNOSIS — Z1231 Encounter for screening mammogram for malignant neoplasm of breast: Secondary | ICD-10-CM

## 2022-11-26 ENCOUNTER — Ambulatory Visit (HOSPITAL_BASED_OUTPATIENT_CLINIC_OR_DEPARTMENT_OTHER)
Admission: RE | Admit: 2022-11-26 | Discharge: 2022-11-26 | Disposition: A | Discharge status: Home / Self Care | Payer: Medicare HMO | Source: Ambulatory Visit | Attending: Obstetrics & Gynecology | Admitting: Obstetrics & Gynecology

## 2022-11-26 ENCOUNTER — Encounter (HOSPITAL_BASED_OUTPATIENT_CLINIC_OR_DEPARTMENT_OTHER): Payer: Self-pay | Admitting: Radiology

## 2022-11-26 DIAGNOSIS — M810 Age-related osteoporosis without current pathological fracture: Secondary | ICD-10-CM | POA: Diagnosis not present

## 2022-11-26 DIAGNOSIS — Z1231 Encounter for screening mammogram for malignant neoplasm of breast: Secondary | ICD-10-CM | POA: Insufficient documentation

## 2022-12-07 ENCOUNTER — Encounter (HOSPITAL_BASED_OUTPATIENT_CLINIC_OR_DEPARTMENT_OTHER): Payer: Self-pay | Admitting: Obstetrics & Gynecology

## 2022-12-07 ENCOUNTER — Ambulatory Visit (HOSPITAL_BASED_OUTPATIENT_CLINIC_OR_DEPARTMENT_OTHER): Payer: Medicare HMO | Admitting: Obstetrics & Gynecology

## 2022-12-07 ENCOUNTER — Other Ambulatory Visit (HOSPITAL_COMMUNITY)
Admission: RE | Admit: 2022-12-07 | Discharge: 2022-12-07 | Disposition: A | Discharge status: Home / Self Care | Payer: Medicare HMO | Source: Ambulatory Visit | Attending: Obstetrics & Gynecology | Admitting: Obstetrics & Gynecology

## 2022-12-07 VITALS — BP 132/78 | HR 64 | Wt 106.6 lb

## 2022-12-07 DIAGNOSIS — R87610 Atypical squamous cells of undetermined significance on cytologic smear of cervix (ASC-US): Secondary | ICD-10-CM | POA: Diagnosis not present

## 2022-12-07 DIAGNOSIS — R87810 Cervical high risk human papillomavirus (HPV) DNA test positive: Secondary | ICD-10-CM

## 2022-12-07 DIAGNOSIS — M810 Age-related osteoporosis without current pathological fracture: Secondary | ICD-10-CM

## 2022-12-07 NOTE — Progress Notes (Unsigned)
GYNECOLOGY  VISIT  CC:   follow up pap, h/o LEEP  HPI: 72 y.o. G5P3003 Widowed White or Caucasian female here for follow up pap smear and h/o LEEP done 06/09/2022.  Denies vaginal bleeding.  She does    Past Medical History:  Diagnosis Date   Atypical mole 03/14/2004   Mid Upper Buttocks (slight)   Atypical mole 03/01/2013   Right Shoulder (mild)   Atypical mole 03/01/2013   Left Back (mild)   Depression    Hyperlipidemia    Macular degeneration     MEDS:   Current Outpatient Medications on File Prior to Visit  Medication Sig Dispense Refill   ALPRAZolam (XANAX) 0.25 MG tablet Take 1 tablet (0.25 mg total) by mouth at bedtime as needed for anxiety. 30 tablet 2   buPROPion (WELLBUTRIN XL) 150 MG 24 hr tablet TAKE 1 TABLET BY MOUTH EVERY DAY 90 tablet 0   Calcium 200 MG TABS Calcium     Cyanocobalamin (B-12 PO) Take 1,000 mcg by mouth.     fluticasone (FLONASE) 50 MCG/ACT nasal spray SPRAY 2 SPRAYS INTO EACH NOSTRIL EVERY DAY 48 mL 1   FOLIC ACID PO Take 800 mg by mouth.      Multiple Vitamins-Minerals (PRESERVISION AREDS 2) CAPS Take 1 tablet by mouth daily in the afternoon.     NON FORMULARY Protandim Synergizer - take one caplet by mouth daily     OVER THE COUNTER MEDICATION Natural D-Hist     Probiotic Product (PROBIOTIC PO) Take by mouth. Women's Probiotic - [place]     No current facility-administered medications on file prior to visit.    ALLERGIES: Acetaminophen and Clindamycin/lincomycin  SH:  ***  ROS  PHYSICAL EXAMINATION:    BP 132/78 (BP Location: Right Arm, Patient Position: Sitting, Cuff Size: Normal) Comment: manual  Pulse 64   Wt 106 lb 9.6 oz (48.4 kg)   LMP 01/26/2002 (Approximate)   BMI 21.53 kg/m     General appearance: alert, cooperative and appears stated age Neck: no adenopathy, supple, symmetrical, trachea midline and thyroid {CHL AMB PHY EX THYROID NORM DEFAULT:[phone]::"normal to inspection and palpation"} CV:  {Exam; heart  brief:31539} Lungs:  {pe lungs ob:314451} Breasts: {Exam; breast:13139::"normal appearance, no masses or tenderness"} Abdomen: soft, non-tender; bowel sounds normal; no masses,  no organomegaly Lymph:  no inguinal LAD noted  Pelvic: External genitalia:  no lesions              Urethra:  normal appearing urethra with no masses, tenderness or lesions              Bartholins and Skenes: normal                 Vagina: {exam; pelvic vaginal:30846}              Cervix: {CHL AMB PHY EX CERVIX NORM DEFAULT:[phone]::"no lesions"}              Bimanual Exam:  Uterus:  {CHL AMB PHY EX UTERUS NORM DEFAULT:[phone]::"normal size, contour, position, consistency, mobility, non-tender"}              Adnexa: {CHL AMB PHY EX ADNEXA NO MASS DEFAULT:[id number]::"no mass, fullness, tenderness"}              Rectovaginal: {yes no:314532}.  Confirms.              Anus:  normal sphincter tone, no lesions  Chaperone, ***, CMA, was present for exam.  Assessment/Plan: 1. ASCUS  with positive high risk HPV cervical - Cytology - PAP( [place])

## 2022-12-08 ENCOUNTER — Other Ambulatory Visit: Payer: Medicare HMO

## 2022-12-08 ENCOUNTER — Ambulatory Visit: Payer: Medicare HMO

## 2022-12-11 LAB — CYTOLOGY - PAP: Diagnosis: NEGATIVE

## 2022-12-13 ENCOUNTER — Other Ambulatory Visit: Payer: Self-pay | Admitting: Adult Health

## 2022-12-13 DIAGNOSIS — F32A Depression, unspecified: Secondary | ICD-10-CM

## 2022-12-28 DIAGNOSIS — R69 Illness, unspecified: Secondary | ICD-10-CM | POA: Diagnosis not present

## 2023-01-11 ENCOUNTER — Ambulatory Visit (HOSPITAL_BASED_OUTPATIENT_CLINIC_OR_DEPARTMENT_OTHER): Payer: Medicare HMO | Admitting: Obstetrics & Gynecology

## 2023-03-15 ENCOUNTER — Other Ambulatory Visit: Payer: Self-pay | Admitting: Adult Health

## 2023-03-15 DIAGNOSIS — F32A Depression, unspecified: Secondary | ICD-10-CM

## 2023-04-07 ENCOUNTER — Ambulatory Visit (INDEPENDENT_AMBULATORY_CARE_PROVIDER_SITE_OTHER): Payer: Medicare HMO | Admitting: Adult Health

## 2023-04-07 ENCOUNTER — Encounter: Payer: Self-pay | Admitting: Adult Health

## 2023-04-07 VITALS — BP 132/70 | HR 71 | Temp 97.7°F | Ht 60.0 in | Wt 109.0 lb

## 2023-04-07 DIAGNOSIS — Z0000 Encounter for general adult medical examination without abnormal findings: Secondary | ICD-10-CM | POA: Diagnosis not present

## 2023-04-07 DIAGNOSIS — F419 Anxiety disorder, unspecified: Secondary | ICD-10-CM

## 2023-04-07 DIAGNOSIS — M810 Age-related osteoporosis without current pathological fracture: Secondary | ICD-10-CM

## 2023-04-07 DIAGNOSIS — E782 Mixed hyperlipidemia: Secondary | ICD-10-CM | POA: Diagnosis not present

## 2023-04-07 DIAGNOSIS — F32A Depression, unspecified: Secondary | ICD-10-CM | POA: Diagnosis not present

## 2023-04-07 DIAGNOSIS — F5101 Primary insomnia: Secondary | ICD-10-CM | POA: Diagnosis not present

## 2023-04-07 LAB — COMPREHENSIVE METABOLIC PANEL
ALT: 19 U/L (ref 0–35)
AST: 25 U/L (ref 0–37)
Albumin: 4.3 g/dL (ref 3.5–5.2)
Alkaline Phosphatase: 72 U/L (ref 39–117)
BUN: 17 mg/dL (ref 6–23)
CO2: 30 meq/L (ref 19–32)
Calcium: 9.4 mg/dL (ref 8.4–10.5)
Chloride: 104 meq/L (ref 96–112)
Creatinine, Ser: 0.89 mg/dL (ref 0.40–1.20)
GFR: 65.35 mL/min (ref >60.00)
Glucose, Bld: 93 mg/dL (ref 70–99)
Potassium: 4.7 meq/L (ref 3.5–5.1)
Sodium: 141 meq/L (ref 135–145)
Total Bilirubin: 0.3 mg/dL (ref 0.2–1.2)
Total Protein: 7 g/dL (ref 6.0–8.3)

## 2023-04-07 LAB — LIPID PANEL
Cholesterol: 199 mg/dL (ref 0–200)
HDL: 80.2 mg/dL (ref >39.00)
LDL Cholesterol: 109 mg/dL — ABNORMAL HIGH (ref 0–99)
NonHDL: 118.37
Total CHOL/HDL Ratio: 2
Triglycerides: 49 mg/dL (ref 0.0–149.0)
VLDL: 9.8 mg/dL (ref 0.0–40.0)

## 2023-04-07 LAB — CBC WITH DIFFERENTIAL/PLATELET
Basophils Absolute: 0 10*3/uL (ref 0.0–0.1)
Basophils Relative: 0.7 % (ref 0.0–3.0)
Eosinophils Absolute: 0.1 10*3/uL (ref 0.0–0.7)
Eosinophils Relative: 2.4 % (ref 0.0–5.0)
HCT: 41.4 % (ref 36.0–46.0)
Hemoglobin: 13.4 g/dL (ref 12.0–15.0)
Lymphocytes Relative: 36.7 % (ref 12.0–46.0)
Lymphs Abs: 2.2 10*3/uL (ref 0.7–4.0)
MCHC: 32.5 g/dL (ref 30.0–36.0)
MCV: 89.2 fl (ref 78.0–100.0)
Monocytes Absolute: 0.5 10*3/uL (ref 0.1–1.0)
Monocytes Relative: 9.3 % (ref 3.0–12.0)
Neutro Abs: 3 10*3/uL (ref 1.4–7.7)
Neutrophils Relative %: 50.9 % (ref 43.0–77.0)
Platelets: 247 10*3/uL (ref 150.0–400.0)
RBC: 4.64 Mil/uL (ref 3.87–5.11)
RDW: 14.3 % (ref 11.5–15.5)
WBC: 5.9 10*3/uL (ref 4.0–10.5)

## 2023-04-07 LAB — VITAMIN D 25 HYDROXY (VIT D DEFICIENCY, FRACTURES): VITD: 72.85 ng/mL (ref 30.00–100.00)

## 2023-04-07 LAB — TSH: TSH: 2.36 u[IU]/mL (ref 0.35–5.50)

## 2023-04-07 NOTE — Progress Notes (Signed)
 Subjective:    Patient ID: Denise Bradley, female    DOB: 1951-01-06, 73 y.o.   MRN: [id number]  HPI Patient presents for yearly preventative medicine examination. She is a pleasant 73 year old female who  has a past medical history of Atypical mole (03/14/2004), Atypical mole (03/01/2013), Atypical mole (03/01/2013), Depression, Hyperlipidemia, and Macular degeneration.  Insomnia - takes Xanax PRN   Anxiety and Depression - Well controlled on Wellbutrin 150 mg ER.   Hyperlipidemia- not currently on medication, has been recommended that she go on a low-dose statin but has refused in the past  Lab Results  Component Value Date   CHOL 222 (H) 04/01/2022   HDL 85.30 04/01/2022   LDLCALC 124 (H) 04/01/2022   LDLDIRECT 126.6 09/24/2011   TRIG 60.0 04/01/2022   CHOLHDL 3 04/01/2022   Osteoporosis - takes calcium and vitamin D - last bone density screen in 2024.  Her T-score was -2.7 in her AP spine and hip measurements.  She does not want to go on any extra medication.   All immunizations and health maintenance protocols were reviewed with the patient and needed orders were placed.  Appropriate screening laboratory values were ordered for the patient including screening of hyperlipidemia, renal function and hepatic function.   Medication reconciliation,  past medical history, social history, problem list and allergies were reviewed in detail with the patient  Goals were established with regard to weight loss, exercise, and  diet in compliance with medications  Wt Readings from Last 3 Encounters:  04/07/23 109 lb (49.4 kg)  12/07/22 106 lb 9.6 oz (48.4 kg)  07/23/22 103 lb (46.7 kg)     She is on recall list for 04/2023 to have repeat colonoscopy. She has had er bone density and mammogram.   She has no acute complaints.    Review of Systems  Constitutional: Negative.   HENT: Negative.    Eyes: Negative.   Respiratory: Negative.    Cardiovascular: Negative.    Gastrointestinal: Negative.   Endocrine: Negative.   Genitourinary: Negative.   Musculoskeletal: Negative.   Skin: Negative.   Allergic/Immunologic: Negative.   Neurological: Negative.   Hematological: Negative.   Psychiatric/Behavioral: Negative.     Past Medical History:  Diagnosis Date   Atypical mole 03/14/2004   Mid Upper Buttocks (slight)   Atypical mole 03/01/2013   Right Shoulder (mild)   Atypical mole 03/01/2013   Left Back (mild)   Depression    Hyperlipidemia    Macular degeneration     Social History   Socioeconomic History   Marital status: Widowed    Spouse name: Not on file   Number of children: Not on file   Years of education: Not on file   Highest education level: Some college, no degree  Occupational History   Not on file  Tobacco Use   Smoking status: Never   Smokeless tobacco: Never  Vaping Use   Vaping status: Never Used  Substance and Sexual Activity   Alcohol use: Yes    Alcohol/week: 7.0 standard drinks of alcohol    Types: 7 Glasses of wine per week   Drug use: No   Sexual activity: Not Currently    Birth control/protection: Post-menopausal  Other Topics Concern   Not on file  Social History Narrative   Retired - Worked with [place] and last May she was laid off. She worked there for 22 years   Going back to school for [employer].    No  pets   Three children ( [place], [place]. [place] ( Son and two daughters- all married. & grandchildren)   States " Does not do anything fun".          Diet: Eat healthy, does not eat fast food.    Exercise: Does not exercise, does not feel like she has time.       Social Drivers of Corporate investment banker Strain: Low Risk  (06/22/2022)   Overall Financial Resource Strain (CARDIA)    Difficulty of Paying Living Expenses: Not hard at all  Food Insecurity: No Food Insecurity (06/22/2022)   Hunger Vital Sign    Worried About Running Out of Food in the Last Year: Never true    Ran Out of  Food in the Last Year: Never true  Transportation Needs: No Transportation Needs (06/22/2022)   PRAPARE - Administrator, Civil Service (Medical): No    Lack of Transportation (Non-Medical): No  Physical Activity: Sufficiently Active (06/22/2022)   Exercise Vital Sign    Days of Exercise per Week: 3 days    Minutes of Exercise per Session: 60 min  Stress: Stress Concern Present (06/22/2022)   [employer] of Occupational Health - Occupational Stress Questionnaire    Feeling of Stress : Very much  Social Connections: Unknown (06/22/2022)   Social Connection and Isolation Panel [NHANES]    Frequency of Communication with Friends and Family: More than three times a week    Frequency of Social Gatherings with Friends and Family: More than three times a week    Attends Religious Services: More than 4 times per year    Active Member of [employer] or Organizations: Patient declined    Attends Banker Meetings: Not on file    Marital Status: Widowed  Intimate Partner Violence: Unknown (05/02/2021)   [facility]   HITS    Physically Hurt: Not on file    Insult or Talk Down To: Not on file    Threaten Physical Harm: Not on file    Scream or Curse: Not on file    Past Surgical History:  Procedure Laterality Date   BELPHAROPTOSIS REPAIR     COLONOSCOPY     FOOT SURGERY  01/15/2012   right   FOOT SURGERY  July 2014   right   KNEE ARTHROSCOPY Right    age 60   LIPOSUCTION  5/05    Family History  Problem Relation Age of Onset   Diabetes Father    Hypertension Father    Heart disease Father        CABG   Cancer Mother 56       cancer unknown primary   COPD Mother    Breast cancer Paternal Aunt    Colon cancer Neg Hx     Allergies  Allergen Reactions   Acetaminophen Anaphylaxis    REACTION: swells throat shut/can't breathe   Clindamycin/Lincomycin Other (See Comments)    Cause Loose Stools// Diarrhea    Current Outpatient  Medications on File Prior to Visit  Medication Sig Dispense Refill   ALPRAZolam (XANAX) 0.25 MG tablet Take 1 tablet (0.25 mg total) by mouth at bedtime as needed for anxiety. 30 tablet 2   buPROPion (WELLBUTRIN XL) 150 MG 24 hr tablet TAKE 1 TABLET BY MOUTH EVERY DAY 90 tablet 0   Calcium 200 MG TABS Calcium     Cyanocobalamin (B-12 PO) Take 1,000 mcg by mouth.  fluticasone (FLONASE) 50 MCG/ACT nasal spray SPRAY 2 SPRAYS INTO EACH NOSTRIL EVERY DAY 48 mL 1   FOLIC ACID PO Take 800 mg by mouth.      milk thistle 175 MG tablet Take 175 mg by mouth daily.     Multiple Vitamins-Minerals (PRESERVISION AREDS 2) CAPS Take 1 tablet by mouth daily in the afternoon.     NON FORMULARY Protandim Synergizer - take one caplet by mouth daily     OVER THE COUNTER MEDICATION Natural D-Hist     Probiotic Product (PROBIOTIC PO) Take by mouth. Women's Probiotic - [place] of [place]     No current facility-administered medications on file prior to visit.    BP 132/70   Pulse 71   Temp 97.7 F (36.5 C) (Oral)   Ht 5' (1.524 m)   Wt 109 lb (49.4 kg)   LMP 01/26/2002 (Approximate)   SpO2 98%   BMI 21.29 kg/m       Objective:   Physical Exam Vitals and nursing note reviewed.  Constitutional:      General: She is not in acute distress.    Appearance: Normal appearance. She is well-developed. She is not ill-appearing.  HENT:     Head: Normocephalic and atraumatic.     Right Ear: Tympanic membrane, ear canal and external ear normal. There is no impacted cerumen.     Left Ear: Tympanic membrane, ear canal and external ear normal. There is no impacted cerumen.     Nose: Nose normal. No congestion or rhinorrhea.     Mouth/Throat:     Mouth: Mucous membranes are moist.     Pharynx: Oropharynx is clear. No oropharyngeal exudate or posterior oropharyngeal erythema.  Eyes:     General:        Right eye: No discharge.        Left eye: No discharge.     Extraocular Movements: Extraocular movements intact.      Conjunctiva/sclera: Conjunctivae normal.     Pupils: Pupils are equal, round, and reactive to light.  Neck:     Thyroid: No thyromegaly.     Vascular: No carotid bruit.     Trachea: No tracheal deviation.  Cardiovascular:     Rate and Rhythm: Normal rate and regular rhythm.     Pulses: Normal pulses.     Heart sounds: Normal heart sounds. No murmur heard.    No friction rub. No gallop.  Pulmonary:     Effort: Pulmonary effort is normal. No respiratory distress.     Breath sounds: Normal breath sounds. No stridor. No wheezing, rhonchi or rales.  Chest:     Chest wall: No tenderness.  Abdominal:     General: Abdomen is flat. Bowel sounds are normal. There is no distension.     Palpations: Abdomen is soft. There is no mass.     Tenderness: There is no abdominal tenderness. There is no right CVA tenderness, left CVA tenderness, guarding or rebound.     Hernia: No hernia is present.  Musculoskeletal:        General: No swelling, tenderness, deformity or signs of injury. Normal range of motion.     Cervical back: Normal range of motion and neck supple.     Right lower leg: No edema.     Left lower leg: No edema.  Lymphadenopathy:     Cervical: No cervical adenopathy.  Skin:    General: Skin is warm and dry.     Coloration: Skin is not  jaundiced or pale.     Findings: No bruising, erythema, lesion or rash.  Neurological:     General: No focal deficit present.     Mental Status: She is alert and oriented to person, place, and time.     Cranial Nerves: No cranial nerve deficit.     Sensory: No sensory deficit.     Motor: No weakness.     Coordination: Coordination normal.     Gait: Gait normal.     Deep Tendon Reflexes: Reflexes normal.  Psychiatric:        Mood and Affect: Mood normal.        Behavior: Behavior normal.        Thought Content: Thought content normal.        Judgment: Judgment normal.       Assessment & Plan:  1. Routine general medical examination at a  health care facility (Primary) Today patient counseled on age appropriate routine health concerns for screening and prevention, each reviewed and up to date or declined. Immunizations reviewed and up to date or declined. Labs ordered and reviewed. Risk factors for depression reviewed and negative. Hearing function and visual acuity are intact. ADLs screened and addressed as needed. Functional ability and level of safety reviewed and appropriate. Education, counseling and referrals performed based on assessed risks today. Patient provided with a copy of personalized plan for preventive services. - Stay active and eat healthy  - Follow up in one year or sooner if needed  2. Anxiety and depression - Continue with Wellbutrin  - CBC with Differential/Platelet; Future - Comprehensive metabolic panel; Future - Lipid panel; Future - TSH; Future  3. Primary insomnia - Continue with Xanax PRN  - CBC with Differential/Platelet; Future - Comprehensive metabolic panel; Future - Lipid panel; Future - TSH; Future  4. Mixed hyperlipidemia - Continues to refuse statin  - CBC with Differential/Platelet; Future - Comprehensive metabolic panel; Future - Lipid panel; Future - TSH; Future  5. Age-related osteoporosis without current pathological fracture - Continue with Vitamin D and bone building exercises  - CBC with Differential/Platelet; Future - Comprehensive metabolic panel; Future - Lipid panel; Future - TSH; Future - VITAMIN D 25 Hydroxy (Vit-D Deficiency, Fractures); Future  Shirline Frees, NP

## 2023-04-07 NOTE — Patient Instructions (Signed)
 It was great seeing you today   We will follow up with you regarding your lab work   Please let me know if you need anything

## 2023-05-06 DIAGNOSIS — L281 Prurigo nodularis: Secondary | ICD-10-CM | POA: Diagnosis not present

## 2023-05-06 DIAGNOSIS — L821 Other seborrheic keratosis: Secondary | ICD-10-CM | POA: Diagnosis not present

## 2023-05-06 DIAGNOSIS — Z85828 Personal history of other malignant neoplasm of skin: Secondary | ICD-10-CM | POA: Diagnosis not present

## 2023-05-06 DIAGNOSIS — L814 Other melanin hyperpigmentation: Secondary | ICD-10-CM | POA: Diagnosis not present

## 2023-05-06 DIAGNOSIS — Z08 Encounter for follow-up examination after completed treatment for malignant neoplasm: Secondary | ICD-10-CM | POA: Diagnosis not present

## 2023-05-06 DIAGNOSIS — D1801 Hemangioma of skin and subcutaneous tissue: Secondary | ICD-10-CM | POA: Diagnosis not present

## 2023-05-06 DIAGNOSIS — Z872 Personal history of diseases of the skin and subcutaneous tissue: Secondary | ICD-10-CM | POA: Diagnosis not present

## 2023-05-06 DIAGNOSIS — L905 Scar conditions and fibrosis of skin: Secondary | ICD-10-CM | POA: Diagnosis not present

## 2023-06-14 ENCOUNTER — Encounter (HOSPITAL_BASED_OUTPATIENT_CLINIC_OR_DEPARTMENT_OTHER): Payer: Self-pay | Admitting: Obstetrics & Gynecology

## 2023-06-14 ENCOUNTER — Other Ambulatory Visit (HOSPITAL_COMMUNITY)
Admission: RE | Admit: 2023-06-14 | Discharge: 2023-06-14 | Disposition: A | Discharge status: Home / Self Care | Source: Ambulatory Visit | Attending: Obstetrics & Gynecology | Admitting: Obstetrics & Gynecology

## 2023-06-14 ENCOUNTER — Ambulatory Visit (HOSPITAL_BASED_OUTPATIENT_CLINIC_OR_DEPARTMENT_OTHER): Payer: Medicare HMO | Admitting: Obstetrics & Gynecology

## 2023-06-14 VITALS — BP 128/78 | HR 65 | Ht 60.0 in | Wt 107.2 lb

## 2023-06-14 DIAGNOSIS — Z9889 Other specified postprocedural states: Secondary | ICD-10-CM | POA: Insufficient documentation

## 2023-06-14 DIAGNOSIS — Z1151 Encounter for screening for human papillomavirus (HPV): Secondary | ICD-10-CM | POA: Insufficient documentation

## 2023-06-14 DIAGNOSIS — B977 Papillomavirus as the cause of diseases classified elsewhere: Secondary | ICD-10-CM | POA: Insufficient documentation

## 2023-06-14 DIAGNOSIS — N879 Dysplasia of cervix uteri, unspecified: Secondary | ICD-10-CM | POA: Diagnosis not present

## 2023-06-14 DIAGNOSIS — Z01411 Encounter for gynecological examination (general) (routine) with abnormal findings: Secondary | ICD-10-CM | POA: Insufficient documentation

## 2023-06-16 ENCOUNTER — Ambulatory Visit (HOSPITAL_BASED_OUTPATIENT_CLINIC_OR_DEPARTMENT_OTHER): Payer: Self-pay | Admitting: Obstetrics & Gynecology

## 2023-06-16 LAB — CYTOLOGY - PAP
Comment: NEGATIVE
Diagnosis: NEGATIVE
High risk HPV: NEGATIVE

## 2023-06-17 DIAGNOSIS — Z9889 Other specified postprocedural states: Secondary | ICD-10-CM | POA: Insufficient documentation

## 2023-06-17 NOTE — Progress Notes (Signed)
 GYNECOLOGY  VISIT  CC:   f/u pap  HPI: 73 y.o. [id number] Widowed White or Caucasian female here for follow up pap smear.  Pt has long hx of +HR HPV.  After ASCUS pap with cytologic atypia noted 05/18/2022 with +HR HPV, subtype 16, colposcopy was again performed.  ECC showed dysplastic detached squamous fragments.  LEEP was then performed 06/09/2022.  Pathology did not show dysplasia.  Follow up pa smear six months later on 12/07/2022 was negative with neg HR HPV.  She is here for repeat today.  Denies any vaginal bleeding or discharge.   Past Medical History:  Diagnosis Date   Atypical mole 03/14/2004   Mid Upper Buttocks (slight)   Atypical mole 03/01/2013   Right Shoulder (mild)   Atypical mole 03/01/2013   Left Back (mild)   Depression    Hyperlipidemia    Macular degeneration     MEDS:   Current Outpatient Medications on File Prior to Visit  Medication Sig Dispense Refill   ALPRAZolam  (XANAX ) 0.25 MG tablet Take 1 tablet (0.25 mg total) by mouth at bedtime as needed for anxiety. 30 tablet 2   buPROPion  (WELLBUTRIN  XL) 150 MG 24 hr tablet TAKE 1 TABLET BY MOUTH EVERY DAY 90 tablet 0   Calcium  200 MG TABS Calcium      Cyanocobalamin  (B-12 PO) Take 1,000 mcg by mouth.     fluticasone  (FLONASE ) 50 MCG/ACT nasal spray SPRAY 2 SPRAYS INTO EACH NOSTRIL EVERY DAY 48 mL 1   FOLIC ACID  PO Take 800 mg by mouth.      milk thistle 175 MG tablet Take 175 mg by mouth daily.     Multiple Vitamins-Minerals (PRESERVISION AREDS 2) CAPS Take 1 tablet by mouth daily in the afternoon.     NON FORMULARY Protandim Synergizer - take one caplet by mouth daily     OVER THE COUNTER MEDICATION Natural D-Hist     Probiotic Product (PROBIOTIC PO) Take by mouth. Women's Probiotic - [place] of [place]     No current facility-administered medications on file prior to visit.    ALLERGIES: Acetaminophen and Clindamycin/lincomycin  SH:  widowed, non smoker  Review of Systems  Constitutional: Negative.    Genitourinary: Negative.     PHYSICAL EXAMINATION:    BP 128/78   Pulse 65   Ht 5' (1.524 m)   Wt 107 lb 3.2 oz (48.6 kg)   LMP 01/26/2002 (Approximate)   BMI 20.94 kg/m     General appearance: alert, cooperative and appears stated age  Lymph:  no inguinal LAD noted Pelvic: External genitalia:  no lesions              Urethra:  normal appearing urethra with no masses, tenderness or lesions              Bartholins and Skenes: normal                 Vagina: atrophic mucosa, no lesions              Cervix: no lesions and well healed, pap obtained  Chaperone, Myrtie Atkinson, CMA, was present for exam.  Assessment/Plan: 1. High risk HPV infection (Primary) - Cytology - PAP( [facility]) - if negative, will plan to repeat pap and HR HPV in 1 year.  2. Cervical dysplasia  3. S/P LEEP

## 2023-07-03 ENCOUNTER — Other Ambulatory Visit: Payer: Self-pay | Admitting: Adult Health

## 2023-07-03 DIAGNOSIS — F419 Anxiety disorder, unspecified: Secondary | ICD-10-CM

## 2023-07-22 DIAGNOSIS — Z0100 Encounter for examination of eyes and vision without abnormal findings: Secondary | ICD-10-CM | POA: Diagnosis not present

## 2023-08-23 DIAGNOSIS — D23121 Other benign neoplasm of skin of left upper eyelid, including canthus: Secondary | ICD-10-CM | POA: Diagnosis not present

## 2023-10-14 ENCOUNTER — Other Ambulatory Visit: Payer: Self-pay | Admitting: Obstetrics & Gynecology

## 2023-10-14 DIAGNOSIS — Z1231 Encounter for screening mammogram for malignant neoplasm of breast: Secondary | ICD-10-CM

## 2023-10-20 ENCOUNTER — Other Ambulatory Visit: Payer: Self-pay | Admitting: Adult Health

## 2023-10-20 DIAGNOSIS — F419 Anxiety disorder, unspecified: Secondary | ICD-10-CM

## 2023-10-22 ENCOUNTER — Encounter: Payer: Self-pay | Admitting: Internal Medicine

## 2023-11-11 NOTE — Progress Notes (Signed)
 Denise Bradley                                          MRN: [id number]   11/11/2023   The [facility] Quality Team Specialist reviewed this patient medical record for the purposes of chart review for care gap closure. The following were reviewed: chart review for care gap closure-colorectal cancer screening.    [facility] Quality Team

## 2023-11-29 ENCOUNTER — Ambulatory Visit
Admission: RE | Admit: 2023-11-29 | Discharge: 2023-11-29 | Disposition: A | Discharge status: Home / Self Care | Source: Ambulatory Visit | Attending: Obstetrics & Gynecology | Admitting: Obstetrics & Gynecology

## 2023-11-29 DIAGNOSIS — Z1231 Encounter for screening mammogram for malignant neoplasm of breast: Secondary | ICD-10-CM

## 2024-06-26 ENCOUNTER — Ambulatory Visit (HOSPITAL_BASED_OUTPATIENT_CLINIC_OR_DEPARTMENT_OTHER): Admitting: Obstetrics & Gynecology
